# Patient Record
Sex: Female | Born: 1945 | Race: Black or African American | Hispanic: No | Marital: Married | Smoking: Former smoker
Health system: Southern US, Community
[De-identification: ages and names within clinical notes are randomized; demographics above are authoritative.]

## PROBLEM LIST (undated history)

## (undated) DIAGNOSIS — E785 Hyperlipidemia, unspecified: Secondary | ICD-10-CM

## (undated) DIAGNOSIS — I10 Essential (primary) hypertension: Secondary | ICD-10-CM

## (undated) DIAGNOSIS — R12 Heartburn: Secondary | ICD-10-CM

## (undated) HISTORY — DX: Essential (primary) hypertension: I10

## (undated) HISTORY — DX: Hyperlipidemia, unspecified: E78.5

## (undated) HISTORY — PX: ABDOMINAL HYSTERECTOMY: SHX81

## (undated) HISTORY — PX: BREAST CYST ASPIRATION: SHX578

## (undated) HISTORY — PX: BREAST EXCISIONAL BIOPSY: SUR124

## (undated) HISTORY — PX: ABDOMINAL HYSTERECTOMY: SUR658

## (undated) HISTORY — DX: Heartburn: R12

---

## 2004-10-06 ENCOUNTER — Ambulatory Visit: Payer: Self-pay | Admitting: Obstetrics and Gynecology

## 2005-01-02 ENCOUNTER — Ambulatory Visit: Payer: Self-pay | Admitting: Gastroenterology

## 2005-10-31 ENCOUNTER — Ambulatory Visit: Payer: Self-pay | Admitting: Obstetrics and Gynecology

## 2006-11-14 ENCOUNTER — Ambulatory Visit: Payer: Self-pay | Admitting: Obstetrics and Gynecology

## 2007-02-21 ENCOUNTER — Ambulatory Visit: Payer: Self-pay

## 2007-11-21 ENCOUNTER — Ambulatory Visit: Payer: Self-pay | Admitting: Obstetrics and Gynecology

## 2008-04-03 ENCOUNTER — Ambulatory Visit: Payer: Self-pay | Admitting: Gastroenterology

## 2008-11-23 ENCOUNTER — Ambulatory Visit: Payer: Self-pay | Admitting: Obstetrics and Gynecology

## 2009-10-04 ENCOUNTER — Ambulatory Visit: Payer: Self-pay | Admitting: Internal Medicine

## 2009-10-28 ENCOUNTER — Ambulatory Visit: Payer: Self-pay | Admitting: Internal Medicine

## 2009-11-04 ENCOUNTER — Ambulatory Visit: Payer: Self-pay | Admitting: Internal Medicine

## 2009-12-01 ENCOUNTER — Ambulatory Visit: Payer: Self-pay | Admitting: Obstetrics and Gynecology

## 2009-12-04 ENCOUNTER — Ambulatory Visit: Payer: Self-pay | Admitting: Internal Medicine

## 2010-12-05 ENCOUNTER — Ambulatory Visit: Payer: Self-pay | Admitting: Obstetrics and Gynecology

## 2011-12-15 ENCOUNTER — Ambulatory Visit: Payer: Self-pay | Admitting: Obstetrics and Gynecology

## 2012-12-16 ENCOUNTER — Ambulatory Visit: Payer: Self-pay | Admitting: Obstetrics and Gynecology

## 2013-01-23 ENCOUNTER — Emergency Department: Payer: Self-pay | Admitting: Emergency Medicine

## 2013-01-23 LAB — BASIC METABOLIC PANEL
Anion Gap: 2 — ABNORMAL LOW (ref 7–16)
BUN: 17 mg/dL (ref 7–18)
Calcium, Total: 9.2 mg/dL (ref 8.5–10.1)
Creatinine: 0.99 mg/dL (ref 0.60–1.30)
EGFR (Non-African Amer.): 58 — ABNORMAL LOW
Glucose: 99 mg/dL (ref 65–99)
Potassium: 3.6 mmol/L (ref 3.5–5.1)
Sodium: 139 mmol/L (ref 136–145)

## 2013-01-23 LAB — CBC
HCT: 40.3 % (ref 35.0–47.0)
MCH: 28.6 pg (ref 26.0–34.0)
MCHC: 34.2 g/dL (ref 32.0–36.0)
MCV: 84 fL (ref 80–100)
Platelet: 235 10*3/uL (ref 150–440)
WBC: 5.1 10*3/uL (ref 3.6–11.0)

## 2013-01-23 LAB — TROPONIN I
Troponin-I: <0.02 ng/mL
Troponin-I: <0.02 ng/mL

## 2013-01-23 LAB — PROTIME-INR
INR: 0.9
Prothrombin Time: 12.4 secs (ref 11.5–14.7)

## 2013-01-23 LAB — PRO B NATRIURETIC PEPTIDE: B-Type Natriuretic Peptide: 10 pg/mL (ref 0–125)

## 2013-12-31 ENCOUNTER — Ambulatory Visit: Payer: Self-pay | Admitting: Obstetrics and Gynecology

## 2014-01-15 ENCOUNTER — Ambulatory Visit: Payer: Self-pay | Admitting: Gastroenterology

## 2014-12-23 ENCOUNTER — Other Ambulatory Visit: Payer: Self-pay | Admitting: Obstetrics and Gynecology

## 2014-12-23 DIAGNOSIS — Z1231 Encounter for screening mammogram for malignant neoplasm of breast: Secondary | ICD-10-CM

## 2015-01-04 ENCOUNTER — Ambulatory Visit: Payer: Self-pay

## 2015-01-06 ENCOUNTER — Ambulatory Visit: Payer: Self-pay

## 2015-01-07 ENCOUNTER — Ambulatory Visit
Admission: RE | Admit: 2015-01-07 | Discharge: 2015-01-07 | Disposition: A | Discharge status: Home / Self Care | Payer: Medicare Other | Source: Ambulatory Visit | Attending: Obstetrics and Gynecology | Admitting: Obstetrics and Gynecology

## 2015-01-07 ENCOUNTER — Other Ambulatory Visit: Payer: Self-pay | Admitting: Obstetrics and Gynecology

## 2015-01-07 DIAGNOSIS — Z1231 Encounter for screening mammogram for malignant neoplasm of breast: Secondary | ICD-10-CM | POA: Insufficient documentation

## 2015-12-29 ENCOUNTER — Other Ambulatory Visit: Payer: Self-pay | Admitting: Obstetrics and Gynecology

## 2015-12-29 DIAGNOSIS — Z1231 Encounter for screening mammogram for malignant neoplasm of breast: Secondary | ICD-10-CM

## 2016-01-17 ENCOUNTER — Ambulatory Visit
Admission: RE | Admit: 2016-01-17 | Discharge: 2016-01-17 | Disposition: A | Discharge status: Home / Self Care | Payer: Medicare Other | Source: Ambulatory Visit | Attending: Obstetrics and Gynecology | Admitting: Obstetrics and Gynecology

## 2016-01-17 DIAGNOSIS — Z1231 Encounter for screening mammogram for malignant neoplasm of breast: Secondary | ICD-10-CM | POA: Insufficient documentation

## 2016-02-15 ENCOUNTER — Encounter: Payer: Self-pay | Admitting: Urology

## 2016-02-15 ENCOUNTER — Ambulatory Visit: Payer: Medicare Other | Admitting: Urology

## 2016-02-15 VITALS — BP 119/79 | HR 91 | Ht 63.25 in | Wt 147.4 lb

## 2016-02-15 DIAGNOSIS — N952 Postmenopausal atrophic vaginitis: Secondary | ICD-10-CM | POA: Diagnosis not present

## 2016-02-15 DIAGNOSIS — N3941 Urge incontinence: Secondary | ICD-10-CM | POA: Diagnosis not present

## 2016-02-15 DIAGNOSIS — N390 Urinary tract infection, site not specified: Secondary | ICD-10-CM | POA: Diagnosis not present

## 2016-02-15 LAB — BLADDER SCAN AMB NON-IMAGING: Scan Result: 11

## 2016-02-15 MED ORDER — ESTROGENS, CONJUGATED 0.625 MG/GM VA CREA: 1.0000 | TOPICAL_CREAM | Freq: Every day | VAGINAL | 12 refills | Status: DC

## 2016-02-15 MED ORDER — ESTRADIOL 0.1 MG/GM VA CREA: TOPICAL_CREAM | VAGINAL | 12 refills | Status: DC

## 2016-02-15 NOTE — Progress Notes (Signed)
02/15/2016 10:08 AM   Anne Oconnell 1945/03/20 [id number]  Referring provider: Jerl MinaJames Hedrick, MD [address] [facility] [place], [place] [id number]  Chief Complaint  Patient presents with  . New Patient (Initial Visit)    recurrent uti referred by Dr. Ronny FlurryHeadrick    HPI: Patient is a 70 -year-old [place] female who is referred to us by, Dr. Burnett ShengHedrick, for recurrent urinary tract infections.  Patient states that she has had two urinary tract infections back to back.    Her symptoms with a urinary tract infection consist of twinge at the end of the stream, urgency and frequency.  She was given two rounds of antibiotics and her symptoms abated.    She denies dysuria, gross hematuria, suprapubic pain, back pain, abdominal pain or flank pain.  She has not had any recent fevers, chills, nausea or vomiting.   She does not have a history of nephrolithiasis, GU surgery or GU trauma.   Reviewing her records,  she has had three documented positive cultures for E.coli over the last two years.    She is not sexually active.  She has not noted a correlation with her urinary tract infections and sexual intercourse.    She is post menopausal.   She denies constipation and/or diarrhea.   She does engage in good perineal hygiene. She does take tub baths.   She does have urge incontinence once or twice a week.  She is not using incontinence pads.   She is not having pain with bladder filling.    She has not had any recent imaging studies.    She is drinking not enough of water daily.  She drinks most of her water at night.  She drinks sweat tea, orange soda's and Pepsi.  Her PVR today was 11 mL.     PMH: Past Medical History:  Diagnosis Date  . Heartburn   . HLD (hyperlipidemia)   . HTN (hypertension)     Surgical History: Past Surgical History:  Procedure Laterality Date  . ABDOMINAL HYSTERECTOMY    . BREAST EXCISIONAL BIOPSY Right    1991 negative  . BREAST  EXCISIONAL BIOPSY Left    1964 negative 1990 negative    Home Medications:    Medication List       Accurate as of 02/15/16 10:08 AM. Always use your most recent med list.          amLODipine-benazepril 5-10 MG capsule Commonly known as:  LOTREL TAKE ONE CAPSULE BY MOUTH ONCE A DAY   aspirin EC 81 MG tablet Take by mouth.   atorvastatin 10 MG tablet Commonly known as:  LIPITOR Take by mouth.   Calcium Carbonate-Vitamin D3 600-400 MG-UNIT Tabs Take by mouth.   conjugated estrogens vaginal cream Commonly known as:  PREMARIN Place 1 Applicatorful vaginally daily. Apply 0.5mg  (pea-sized amount)  just inside the vaginal introitus with a finger-tip every night for two weeks and then Monday, Wednesday and Friday nights.   estradiol 0.1 MG/GM vaginal cream Commonly known as:  ESTRACE VAGINAL Apply 0.5mg  (pea-sized amount)  just inside the vaginal introitus with a finger-tip every night for two weeks and then Monday, Wednesday and Friday nights.   FISH OIL PO Take by mouth.   multivitamin capsule Take by mouth.   omeprazole 40 MG capsule Commonly known as:  PRILOSEC Take by mouth.   VITAMIN D-1000 MAX ST 1000 units tablet Generic drug:  Cholecalciferol Take by mouth.  Allergies: No Known Allergies  Family History: Family History  Problem Relation Age of Onset  . Liver cancer Maternal Aunt   . Breast cancer Neg Hx   . Kidney cancer Neg Hx   . Prostate cancer Neg Hx   . Bladder Cancer Neg Hx     Social History:  reports that she quit smoking about 47 years ago. Her smoking use included Cigarettes. She has never used smokeless tobacco. She reports that she does not drink alcohol or use drugs.  ROS: UROLOGY Frequent Urination?: No Hard to postpone urination?: No Burning/pain with urination?: No Get up at night to urinate?: Yes Leakage of urine?: No Urine stream starts and stops?: No Trouble starting stream?: No Do you have to strain to urinate?:  No Blood in urine?: No Urinary tract infection?: Yes Sexually transmitted disease?: No Injury to kidneys or bladder?: No Painful intercourse?: No Weak stream?: No Currently pregnant?: No Vaginal bleeding?: No Last menstrual period?: n  Gastrointestinal Nausea?: No Vomiting?: No Indigestion/heartburn?: Yes Diarrhea?: No Constipation?: No  Constitutional Fever: No Night sweats?: Yes Weight loss?: No Fatigue?: No  Skin Skin rash/lesions?: No Itching?: No  Eyes Blurred vision?: No Double vision?: No  Ears/Nose/Throat Sore throat?: No Sinus problems?: Yes  Hematologic/Lymphatic Swollen glands?: No Easy bruising?: No  Cardiovascular Leg swelling?: No Chest pain?: No  Respiratory Cough?: No Shortness of breath?: No  Endocrine Excessive thirst?: No  Musculoskeletal Back pain?: No Joint pain?: No  Neurological Headaches?: No Dizziness?: No  Psychologic Depression?: No Anxiety?: No  Physical Exam: BP 119/79   Pulse 91   Ht 5' 3.25" (1.607 m)   Wt 147 lb 6.4 oz (66.9 kg)   BMI 25.90 kg/m   Constitutional: Well nourished. Alert and oriented, No acute distress. HEENT: [place] AT, moist mucus membranes. Trachea midline, no masses. Cardiovascular: No clubbing, cyanosis, or edema. Respiratory: Normal respiratory effort, no increased work of breathing. GI: Abdomen is soft, non tender, non distended, no abdominal masses. Liver and spleen not palpable.  No hernias appreciated.  Stool sample for occult testing is not indicated.   GU: No CVA tenderness.  No bladder fullness or masses.  Atrophic external genitalia, normal pubic hair distribution, no lesions.  Normal urethral meatus, no lesions, no prolapse, no discharge.   No urethral masses, tenderness and/or tenderness. No bladder fullness, tenderness or masses. Pale vagina mucosa, poor estrogen effect, no discharge, no lesions, good pelvic support, Grade II cystocele is noted.  Rectocele is noted.  Cervix, uterus  and adnexa are surgically absent.  Anus and perineum are without rashes or lesions.    Skin: No rashes, bruises or suspicious lesions. Lymph: No cervical or inguinal adenopathy. Neurologic: Grossly intact, no focal deficits, moving all 4 extremities. Psychiatric: Normal mood and affect.  Laboratory Data: Lab Results  Component Value Date   WBC 5.1 01/23/2013   HGB 13.8 01/23/2013   HCT 40.3 01/23/2013   MCV 84 01/23/2013   PLT 235 01/23/2013    Lab Results  Component Value Date   CREATININE 0.99 01/23/2013    Pertinent Imaging: Results for Anne Oconnell, Anne Oconnell (MRN [id number]) as of 02/15/2016 09:51  Ref. Range 02/15/2016 09:28  Scan Result Unknown 11    Assessment & Plan:    1. Recurrent UTI's  - Patient is instructed to increase her water intake until the urine is pale yellow or clear.  I have advised her to take probiotics (yogurt, oral pills or vaginal suppositories), take cranberry pills or drink the juice and  use the estrogen cream.  She is to take Vitamin C 1,000 mg daily to acidify the urine.  She should also avoid soaking in tubs and wipe front to back after urinating.  She may benefit from core strengthening exercises.  We can refer her to PT if she desires.    - Because of her history of recurrent UTIs, I have asked the patient to contact our office if she should experience symptoms of urinary tract infection so that we can CATH her for an urine specimen for urinalysis and culture. This is to prevent a skin contaminant from showing up in the urine culture.  If she should have her symptoms after hours or cannot get to our office, she should notify her other providers that she needs a catheterized specimen for UA and culture.   - I reviewed the symptoms of a urinary tract infection, such as a worsening of urinary urgency and frequency, dysuria, which is painful urination and not the pain of urine hitting sensitive perineal skin, hematuria, foul-smelling urine, suprapubic pain or  mental status changes. Fevers, chills, nausea and or vomiting can also be signs of a possible UTI.  Positive urinalyses and positive urine cultures that are not associated with urinary symptoms should not be treated with antibiotics.    - I explained to the patient that being exposed to unnecessary antibiotics can put her at risk for increasing resistance of the bacteria to antibiotics, C. difficile and the side effects of the antibiotics.    2. Urge incontinence  - Patient encouraged to stop drinking sodas and sweat tea  3. Vaginal atrophy  - I explained to the patient that when women go through menopause and her estrogen levels are severely diminished, the normal vaginal flora will change.  This is due to an increase of the vaginal canal's pH. Because of this, the vaginal canal may be colonized by bacteria from the rectum instead of the protective lactobacillus.  This accompanied by the loss of the mucus barrier with vaginal atrophy is a cause of recurrent urinary tract infections.  - In some studies, the use of vaginal estrogen cream has been demonstrated to reduce  recurrent urinary tract infections to one a year.   - Patient was given a sample of vaginal estrogen cream (Premarin/Estrace) and instructed to apply 0.5mg  (pea-sized amount)  just inside the vaginal introitus with a finger-tip every night for two weeks and then Monday, Wednesday and Friday nights.  I explained to the patient that vaginally administered estrogen, which causes only a slight increase in the blood estrogen levels, have fewer contraindications and adverse systemic effects that oral HT.  - I have also given prescriptions for the Estrace cream and Premarin cream, so that the patient may carry them to the pharmacy to see which one of the branded creams would be most economical for her.  If she finds both medications cost prohibitive, she is instructed to call the office.  We can then call in a compounded vaginal estrogen cream for  the patient that may be more affordable.    - She will follow up in three months for an exam.                              Return in about 3 months (around 05/15/2016) for exam.  These notes generated with voice recognition software. I apologize for typographical errors.  Michiel CowboySHANNON Judy Pollman, PA-C  [facility] [address]  [address], [place], [place] [address] [phone]

## 2016-02-15 NOTE — Patient Instructions (Signed)
                                             Urinary Tract Infection Prevention Patient Education Stay Hydrated: Urinary tract infections (UTIs) are less likely to occur in someone who is drinking enough water to promote regular urination, so it is very important to stay hydrated in order to help flush out bacteria from the urinary tract. Respond to "Nature's Call": It is always a good idea to urinate as soon as you feel the need. While "holding it in" does not directly cause an infection, it can cause overdistension that can damage the lining of the bladder, making it more vulnerable to bacteria. Remove Tampons Before Going: Remember to always take out tampons before urinating, and change tampons often.  Practice Proper Bathroom Hygiene: To keep bacteria near the urethral opening to a minimum, it is important to practice proper wiping techniques (i.e. front to back wiping) to help prevent rectal bacteria from entering the uretro-genital area. It can also be helpful to take showers and avoid soaking in the bathtub.  Take a Vitamin C Supplement: About 1,000 milligrams of vitamin C taken daily can help inhibit the growth of some bacteria by acidifying the urine. Maintain Control with Cranberries: Cranberries contain hippuronic acid, which is a natural antiseptic that may help prevent the adherence of bacteria to the bladder lining. Drinking 100% pure cranberry juice or taking over the counter cranberry supplements twice daily may help to prevent an infection. However, it is important to note that cranberry juices/supplements are not helpful once a urinary tract infection (UTI) is present. Strengthen Your Core: Often, a lazy bladder (unable to empty urine properly) occurs due to lower back problem, so consider doing exercises to help strengthen your back, pelvic floor, and stomach muscles.  Pay Attention to Your Urine: Your urine can change color for a variety of reasons, including from the medications you  take, so pay close attention to it to monitor your overall health. One key thing to note is that if your urine is typically a darker yellow, your body is dehydrated, so you need to step up your water intake.    Apply vaginal estrogen cream with fingertip on Monday nights, Wednesday nights and Friday nights

## 2016-05-17 NOTE — Progress Notes (Signed)
05/18/2016 9:29 AM   Anne Oconnell 04-09-1945 [id number]  Referring provider: Jerl Mina, MD [address] [facility] [place], [place] [address]  Chief Complaint  Patient presents with  . Urinary Incontinence    3 month follow up   . Vaginal Atrophy    HPI: 71 yo AAF who presents for a three month follow up for a history of recurrent UTI's, urge incontinence and vaginal atrophy.  Background history Patient is a 80 -year-old [place] American female who is referred to [place] by, Dr. Burnett Sheng, for recurrent urinary tract infections.  Patient states that she has had two urinary tract infections back to back.  Her symptoms with a urinary tract infection consist of twinge at the end of the stream, urgency and frequency.  She was given two rounds of antibiotics and her symptoms abated.  She denies dysuria, gross hematuria, suprapubic pain, back pain, abdominal pain or flank pain.  She has not had any recent fevers, chills, nausea or vomiting.  She does not have a history of nephrolithiasis, GU surgery or GU trauma.  Reviewing her records,  she has had three documented positive cultures for E.coli over the last two years.  She is not sexually active.  She has not noted a correlation with her urinary tract infections and sexual intercourse.  She is post menopausal.  She denies constipation and/or diarrhea.   She does engage in good perineal hygiene. She does take tub baths.   She does have urge incontinence once or twice a week.  She is not using incontinence pads.  She is not having pain with bladder filling.   She has not had any recent imaging studies.  She is drinking not enough of water daily.  She drinks most of her water at night.  She drinks sweat tea, orange soda's and Pepsi.  Her PVR today was 11 mL.    History of recurrent UTI's She has had three documented positive cultures for E.coli over the last two years.  Risk factors for UTI's:  Vaginal atrophy, soaking in tubs, incontinence  and consumption of sugary drinks.  At her last appointment we discussed UTI prevention strategies.  She has not had an UTI since her visit with [place] three months ago.  She is not experiencing gross hematuria, dysuria and suprapubic.  She does not have fevers, chills, nausea or vomiting.    Urge incontinence Patient advised to avoid sugary drinks.  She is experiencing urgency x 0-3, frequency x 4-7, she does not limit fluids to avoid using the rest room, she does not engage in toilet mapping, incontinence x 0-3 and nocturia x 0-3.    Vaginal atrophy Patient is applying the vaginal estrogen cream 3 nights weekly.   PMH: Past Medical History:  Diagnosis Date  . Heartburn   . HLD (hyperlipidemia)   . HTN (hypertension)     Surgical History: Past Surgical History:  Procedure Laterality Date  . ABDOMINAL HYSTERECTOMY    . BREAST EXCISIONAL BIOPSY Right    1991 negative  . BREAST EXCISIONAL BIOPSY Left    1964 negative 1990 negative    Home Medications:  Allergies as of 05/18/2016   No Known Allergies     Medication List       Accurate as of 05/18/16  9:29 AM. Always use your most recent med list.          amLODipine-benazepril 5-10 MG capsule Commonly known as:  LOTREL TAKE ONE CAPSULE BY MOUTH ONCE  A DAY   aspirin EC 81 MG tablet Take by mouth.   atorvastatin 10 MG tablet Commonly known as:  LIPITOR Take by mouth.   Calcium Carbonate-Vitamin D3 600-400 MG-UNIT Tabs Take by mouth.   conjugated estrogens vaginal cream Commonly known as:  PREMARIN Place 1 Applicatorful vaginally daily. Apply 0.5mg  (pea-sized amount)  just inside the vaginal introitus with a finger-tip every night for two weeks and then Monday, Wednesday and Friday nights.   estradiol 0.1 MG/GM vaginal cream Commonly known as:  ESTRACE VAGINAL Apply 0.5mg  (pea-sized amount)  just inside the vaginal introitus with a finger-tip every night for two weeks and then Monday, Wednesday and Friday nights.     FISH OIL PO Take by mouth.   multivitamin capsule Take by mouth.   omeprazole 40 MG capsule Commonly known as:  PRILOSEC Take by mouth.   VITAMIN D-1000 MAX ST 1000 units tablet Generic drug:  Cholecalciferol Take by mouth.       Allergies: No Known Allergies  Family History: Family History  Problem Relation Age of Onset  . Liver cancer Maternal Aunt   . Breast cancer Neg Hx   . Kidney cancer Neg Hx   . Prostate cancer Neg Hx   . Bladder Cancer Neg Hx     Social History:  reports that she quit smoking about 48 years ago. Her smoking use included Cigarettes. She has never used smokeless tobacco. She reports that she drinks alcohol. She reports that she does not use drugs.  ROS: UROLOGY Frequent Urination?: No Hard to postpone urination?: No Burning/pain with urination?: No Get up at night to urinate?: Yes Leakage of urine?: No Urine stream starts and stops?: No Trouble starting stream?: No Do you have to strain to urinate?: No Blood in urine?: No Urinary tract infection?: No Sexually transmitted disease?: No Injury to kidneys or bladder?: No Painful intercourse?: No Weak stream?: No Currently pregnant?: No Vaginal bleeding?: No Last menstrual period?: n  Gastrointestinal Nausea?: No Vomiting?: No Indigestion/heartburn?: No Diarrhea?: No Constipation?: No  Constitutional Fever: No Night sweats?: No Weight loss?: No Fatigue?: No  Skin Skin rash/lesions?: No Itching?: No  Eyes Blurred vision?: No Double vision?: No  Ears/Nose/Throat Sore throat?: No Sinus problems?: No  Hematologic/Lymphatic Swollen glands?: No Easy bruising?: No  Cardiovascular Leg swelling?: No Chest pain?: No  Respiratory Cough?: No Shortness of breath?: No  Endocrine Excessive thirst?: No  Musculoskeletal Back pain?: No Joint pain?: No  Neurological Headaches?: No Dizziness?: No  Psychologic Depression?: No Anxiety?: No  Physical Exam: BP 111/76    Pulse 94   Ht  (1.6 m)   Wt 148 lb 3.2 oz (67.2 kg)   BMI 26.25 kg/m   Constitutional: Well nourished. Alert and oriented, No acute distress. HEENT: [place] AT, moist mucus membranes. Trachea midline, no masses. Cardiovascular: No clubbing, cyanosis, or edema. Respiratory: Normal respiratory effort, no increased work of breathing. GI: Abdomen is soft, non tender, non distended, no abdominal masses. Liver and spleen not palpable.  No hernias appreciated.  Stool sample for occult testing is not indicated.   GU: No CVA tenderness.  No bladder fullness or masses.  Atrophic external genitalia, normal pubic hair distribution, no lesions.  Normal urethral meatus, no lesions, no prolapse, no discharge.   No urethral masses, tenderness and/or tenderness. No bladder fullness, tenderness or masses. Pale vagina mucosa, poor estrogen effect, no discharge, no lesions, good pelvic support, Grade II cystocele is noted.  Rectocele is noted.  Cervix, uterus  and adnexa are surgically absent.  Anus and perineum are without rashes or lesions.    Skin: No rashes, bruises or suspicious lesions. Lymph: No cervical or inguinal adenopathy. Neurologic: Grossly intact, no focal deficits, moving all 4 extremities. Psychiatric: Normal mood and affect.  Laboratory Data: Lab Results  Component Value Date   WBC 5.1 01/23/2013   HGB 13.8 01/23/2013   HCT 40.3 01/23/2013   MCV 84 01/23/2013   PLT 235 01/23/2013    Lab Results  Component Value Date   CREATININE 0.99 01/23/2013    Assessment & Plan:    1. Recurrent UTI's  - Reviewed UTI prevention strategies  - No infections since we had last seen the patient  - Asked the patient is to contact us if she should experience UTI symptoms so that we may get a catheter specimen for urinalysis and culture   2. Urge incontinence  - Improved with the vaginal estrogen cream  3. Vaginal atrophy  - Continue applying the vaginal estrogen cream 3 nights weekly                      Return in about 1 year (around 05/18/2017) for PVR, exam and OAB questionnaire.  These notes generated with voice recognition software. I apologize for typographical errors.  Michiel Cowboy, PA-C  [facility] [address] [place], [place] [address] [phone]

## 2016-05-18 ENCOUNTER — Ambulatory Visit: Payer: Medicare Other | Admitting: Urology

## 2016-05-18 ENCOUNTER — Encounter: Payer: Self-pay | Admitting: Urology

## 2016-05-18 VITALS — BP 111/76 | HR 94 | Ht 63.0 in | Wt 148.2 lb

## 2016-05-18 DIAGNOSIS — N3941 Urge incontinence: Secondary | ICD-10-CM | POA: Diagnosis not present

## 2016-05-18 DIAGNOSIS — N952 Postmenopausal atrophic vaginitis: Secondary | ICD-10-CM | POA: Diagnosis not present

## 2016-05-18 DIAGNOSIS — N390 Urinary tract infection, site not specified: Secondary | ICD-10-CM

## 2017-01-11 ENCOUNTER — Other Ambulatory Visit: Payer: Self-pay | Admitting: Obstetrics and Gynecology

## 2017-01-11 DIAGNOSIS — Z1231 Encounter for screening mammogram for malignant neoplasm of breast: Secondary | ICD-10-CM

## 2017-02-06 ENCOUNTER — Ambulatory Visit
Admission: RE | Admit: 2017-02-06 | Discharge: 2017-02-06 | Disposition: A | Discharge status: Home / Self Care | Payer: Medicare Other | Source: Ambulatory Visit | Attending: Obstetrics and Gynecology | Admitting: Obstetrics and Gynecology

## 2017-02-06 DIAGNOSIS — Z1231 Encounter for screening mammogram for malignant neoplasm of breast: Secondary | ICD-10-CM | POA: Insufficient documentation

## 2017-05-16 NOTE — Progress Notes (Signed)
05/17/2017 9:45 AM   Anne Oconnell Sep 25, 1945 [id number]  Referring provider: Jerl Mina, MD [address] [facility] [place], [place] [address]  No chief complaint on file.   HPI: 72 yo AAF who presents for a one year follow up for a history of recurrent UTI's, urge incontinence and vaginal atrophy.  History of recurrent UTI's Risk factors for UTI's:  Vaginal atrophy, soaking in tubs, incontinence and consumption of sugary drinks.  At her last appointment we discussed UTI prevention strategies.  She has not had an UTI since her visit with [place] 12 months ago.  She is not experiencing gross hematuria, dysuria and suprapubic.  She does not have fevers, chills, nausea or vomiting.  She has not had an UTI since her last visit with [place].    Urge incontinence Patient advised to avoid sugary drinks.  She is experiencing urgency x 0-3 (stable), frequency x 4-7 (stable), she does not limit fluids to avoid using the rest room, she does not engage in toilet mapping, incontinence x 0-3 (stable) and nocturia x 0-3 (stable).  Her main complaint is nocturia.  Patient denies any gross hematuria, dysuria or suprapubic/flank pain.  Patient denies any fevers, chills, nausea or vomiting.   Vaginal atrophy Patient is applying the vaginal estrogen cream occasionally.    PMH: Past Medical History:  Diagnosis Date  . Heartburn   . HLD (hyperlipidemia)   . HTN (hypertension)     Surgical History: Past Surgical History:  Procedure Laterality Date  . ABDOMINAL HYSTERECTOMY    . BREAST CYST ASPIRATION Left    Pt thinks it was her left breast was done yrs ago.  Marland Kitchen BREAST EXCISIONAL BIOPSY Right    1991 negative  . BREAST EXCISIONAL BIOPSY Left    1964 negative 1990 negative    Home Medications:  Allergies as of 05/17/2017   No Known Allergies     Medication List        Accurate as of 05/17/17  9:45 AM. Always use your most recent med list.          amLODipine-benazepril 5-10 MG  capsule Commonly known as:  LOTREL TAKE ONE CAPSULE BY MOUTH ONCE A DAY   aspirin EC 81 MG tablet Take by mouth.   atorvastatin 10 MG tablet Commonly known as:  LIPITOR Take by mouth.   Calcium Carbonate-Vitamin D3 600-400 MG-UNIT Tabs Take by mouth.   conjugated estrogens vaginal cream Commonly known as:  PREMARIN Place 1 Applicatorful vaginally daily. Apply 0.5mg  (pea-sized amount)  just inside the vaginal introitus with a finger-tip every night for two weeks and then Monday, Wednesday and Friday nights.   estradiol 0.1 MG/GM vaginal cream Commonly known as:  ESTRACE VAGINAL Apply 0.5mg  (pea-sized amount)  just inside the vaginal introitus with a finger-tip every night for two weeks and then Monday, Wednesday and Friday nights.   FISH OIL PO Take by mouth.   multivitamin capsule Take by mouth.   omeprazole 40 MG capsule Commonly known as:  PRILOSEC Take by mouth.   VITAMIN D-1000 MAX ST 1000 units tablet Generic drug:  Cholecalciferol Take by mouth.       Allergies: No Known Allergies  Family History: Family History  Problem Relation Age of Onset  . Liver cancer Maternal Aunt   . Breast cancer Neg Hx   . Kidney cancer Neg Hx   . Prostate cancer Neg Hx   . Bladder Cancer Neg Hx     Social History:  reports that she quit smoking about 49 years ago. Her smoking use included cigarettes. she has never used smokeless tobacco. She reports that she drinks alcohol. She reports that she does not use drugs.  ROS: UROLOGY Frequent Urination?: No Hard to postpone urination?: No Burning/pain with urination?: No Get up at night to urinate?: Yes Leakage of urine?: No Urine stream starts and stops?: No Trouble starting stream?: No Do you have to strain to urinate?: No Blood in urine?: No Urinary tract infection?: No Sexually transmitted disease?: No Injury to kidneys or bladder?: No Painful intercourse?: No Weak stream?: No Currently pregnant?: No Vaginal  bleeding?: No Last menstrual period?: n  Gastrointestinal Nausea?: No Vomiting?: No Indigestion/heartburn?: No Diarrhea?: No Constipation?: No  Constitutional Fever: No Night sweats?: No Weight loss?: No Fatigue?: No  Skin Skin rash/lesions?: No Itching?: No  Eyes Blurred vision?: No Double vision?: No  Ears/Nose/Throat Sore throat?: No Sinus problems?: No  Hematologic/Lymphatic Swollen glands?: No Easy bruising?: No  Cardiovascular Leg swelling?: No Chest pain?: No  Respiratory Cough?: No Shortness of breath?: No  Endocrine Excessive thirst?: No  Musculoskeletal Back pain?: No Joint pain?: No  Neurological Headaches?: No Dizziness?: No  Psychologic Depression?: No Anxiety?: No  Physical Exam: BP 112/78   Pulse (!) 102   Resp 16   Ht 5\' 4"  (1.626 m)   Wt 145 lb (65.8 kg)   SpO2 98%   BMI 24.89 kg/m   Constitutional: Well nourished. Alert and oriented, No acute distress. HEENT: [place] AT, moist mucus membranes. Trachea midline, no masses. Cardiovascular: No clubbing, cyanosis, or edema. Respiratory: Normal respiratory effort, no increased work of breathing. GI: Abdomen is soft, non tender, non distended, no abdominal masses. Liver and spleen not palpable.  No hernias appreciated.  Stool sample for occult testing is not indicated.   GU: No CVA tenderness.  No bladder fullness or masses.  Atrophic external genitalia, normal pubic hair distribution, no lesions.  Normal urethral meatus, no lesions, no prolapse, no discharge.   No urethral masses, tenderness and/or tenderness. No bladder fullness, tenderness or masses. Pale vagina mucosa, poor estrogen effect, no discharge, no lesions, poor pelvic support, Grade II cystocele noted.  Rectocele noted.  Cervix, uterus and adnexa are surgically absent.  Anus and perineum are without rashes or lesions.    Skin: No rashes, bruises or suspicious lesions. Lymph: No cervical or inguinal adenopathy. Neurologic:  Grossly intact, no focal deficits, moving all 4 extremities. Psychiatric: Normal mood and affect.    Laboratory Data: Lab Results  Component Value Date   WBC 5.1 01/23/2013   HGB 13.8 01/23/2013   HCT 40.3 01/23/2013   MCV 84 01/23/2013   PLT 235 01/23/2013    Lab Results  Component Value Date   CREATININE 0.99 01/23/2013    Assessment & Plan:    1. History of recurrent UTI's  - Reviewed UTI prevention strategies  - No infections since we had last seen the patient  - Asked the patient is to contact us if she should experience UTI symptoms so that we may get a catheter specimen for urinalysis and culture   2. Urge incontinence  - Resolved  3. Vaginal atrophy  - Continue applying the vaginal estrogen cream as needed  4. Nocturia Not bothersome at this time Will reassess in one year                  Return in about 1 year (around 05/18/2018) for OAB questionnaire, PVR and exam.  These notes generated with voice recognition software. I apologize for typographical errors.  Zara Council, [facility] [address], [place], [place] [address] [phone]

## 2017-05-17 ENCOUNTER — Ambulatory Visit (INDEPENDENT_AMBULATORY_CARE_PROVIDER_SITE_OTHER): Payer: Medicare Other | Admitting: Urology

## 2017-05-17 ENCOUNTER — Encounter: Payer: Self-pay | Admitting: Urology

## 2017-05-17 VITALS — BP 112/78 | HR 102 | Resp 16 | Ht 64.0 in | Wt 145.0 lb

## 2017-05-17 DIAGNOSIS — N3941 Urge incontinence: Secondary | ICD-10-CM | POA: Diagnosis not present

## 2017-05-17 DIAGNOSIS — R351 Nocturia: Secondary | ICD-10-CM | POA: Diagnosis not present

## 2017-05-17 DIAGNOSIS — Z87440 Personal history of urinary (tract) infections: Secondary | ICD-10-CM

## 2017-05-17 DIAGNOSIS — N952 Postmenopausal atrophic vaginitis: Secondary | ICD-10-CM

## 2018-01-15 ENCOUNTER — Other Ambulatory Visit: Payer: Self-pay | Admitting: Obstetrics and Gynecology

## 2018-01-15 DIAGNOSIS — Z1231 Encounter for screening mammogram for malignant neoplasm of breast: Secondary | ICD-10-CM

## 2018-02-13 ENCOUNTER — Ambulatory Visit
Admission: RE | Admit: 2018-02-13 | Discharge: 2018-02-13 | Disposition: A | Discharge status: Home / Self Care | Payer: Medicare Other | Source: Ambulatory Visit | Attending: Obstetrics and Gynecology | Admitting: Obstetrics and Gynecology

## 2018-02-13 DIAGNOSIS — Z1231 Encounter for screening mammogram for malignant neoplasm of breast: Secondary | ICD-10-CM | POA: Insufficient documentation

## 2018-05-22 NOTE — Progress Notes (Incomplete)
05/24/2018 3:08 PM   Anne Oconnell Sep 16, 1945 [id number]  Referring provider: Jerl Mina, MD [address] [facility] [place], [place] [address]  No chief complaint on file.   HPI: Anne Oconnell is a 73 y.o. female who presents for a one year follow up for a history of recurrent UTI's, urge incontinence and vaginal atrophy.  History of recurrent UTI's Risk factors for UTI's:  Vaginal atrophy, soaking in tubs, incontinence and consumption of sugary drinks.  On her 05/2016 visit we discussed UTI prevention strategies.  On her 05/17/2017, she had not had an UTI since her visit with [place] 12 months prior.  She was not experiencing gross hematuria, dysuria and suprapubic.  She denied fevers, chills, nausea or vomiting.  *** She has not had an UTI since her last visit with [place].  Urge incontinence Patient has been advised to avoid sugary drinks.  On 05/17/2017, she was experiencing urgency x 0-3 (stable), frequency x 4-7 (stable), she did not limit fluids to avoid using the rest room, she did not engage in toilet mapping, incontinence x 0-3 (stable) and nocturia x 0-3 (stable).  Her main complaint was nocturia.  Patient denied any gross hematuria, dysuria or suprapubic/flank pain.  Patient denied any fevers, chills, nausea or vomiting.   Today (05/24/2018), the patient is experiencing urgency x *** (***), frequency x *** (***), not/is restricting fluids to avoid visits to the restroom ***, not/is engaging in toilet mapping, incontinence x *** (***) and nocturia x *** (***).  Her BP is ***.  Her PVR is *** mL.  ***  Vaginal atrophy On 05/17/2017, patient reported that she was applying the vaginal estrogen cream occasionally.    PMH: Past Medical History:  Diagnosis Date   Heartburn    HLD (hyperlipidemia)    HTN (hypertension)     Surgical History: Past Surgical History:  Procedure Laterality Date   ABDOMINAL HYSTERECTOMY     BREAST CYST ASPIRATION Left    Pt thinks  it was her left breast was done yrs ago.   BREAST EXCISIONAL BIOPSY Right    1991 negative   BREAST EXCISIONAL BIOPSY Left    1964 negative 1990 negative    Home Medications:  Allergies as of 05/24/2018   No Known Allergies     Medication List       Accurate as of May 22, 2018  3:08 PM. Always use your most recent med list.        amLODipine-benazepril 5-10 MG capsule Commonly known as:  LOTREL TAKE ONE CAPSULE BY MOUTH ONCE A DAY   aspirin EC 81 MG tablet Take by mouth.   atorvastatin 10 MG tablet Commonly known as:  LIPITOR Take by mouth.   Calcium Carbonate-Vitamin D3 600-400 MG-UNIT Tabs Take by mouth.   conjugated estrogens vaginal cream Commonly known as:  Premarin Place 1 Applicatorful vaginally daily. Apply 0.5mg  (pea-sized amount)  just inside the vaginal introitus with a finger-tip every night for two weeks and then Monday, Wednesday and Friday nights.   estradiol 0.1 MG/GM vaginal cream Commonly known as:  ESTRACE VAGINAL Apply 0.5mg  (pea-sized amount)  just inside the vaginal introitus with a finger-tip every night for two weeks and then Monday, Wednesday and Friday nights.   FISH OIL PO Take by mouth.   multivitamin capsule Take by mouth.   omeprazole 40 MG capsule Commonly known as:  PRILOSEC Take by mouth.   Vitamin D-1000 Max St 25 MCG (1000 UT) tablet Generic  drug:  Cholecalciferol Take by mouth.       Allergies: No Known Allergies  Family History: Family History  Problem Relation Age of Onset   Liver cancer Maternal Aunt    Breast cancer Neg Hx    Kidney cancer Neg Hx    Prostate cancer Neg Hx    Bladder Cancer Neg Hx     Social History:  reports that she quit smoking about 50 years ago. Her smoking use included cigarettes. She has never used smokeless tobacco. She reports current alcohol use. She reports that she does not use drugs.  ROS:                                        Physical  Exam: There were no vitals taken for this visit.  Constitutional:  Well nourished. Alert and oriented, No acute distress. {HEENT: [place] AT, moist mucus membranes.  Trachea midline, no masses.} Cardiovascular: No clubbing, cyanosis, or edema. Respiratory: Normal respiratory effort, no increased work of breathing. {GI: Abdomen is soft, non tender, non distended, no abdominal masses. Liver and spleen not palpable.  No hernias appreciated.  Stool sample for occult testing is not indicated.} {GU: No CVA tenderness.  No bladder fullness or masses.  Patient with circumcised/uncircumcised phallus. ***Foreskin easily retracted***  Urethral meatus is patent.  No penile discharge. No penile lesions or rashes. Scrotum without lesions, cysts, rashes and/or edema.  Testicles are located scrotally bilaterally. No masses are appreciated in the testicles. Left and right epididymis are normal.} {Rectal: Patient with normal sphincter tone.  Anus and perineum without scarring or rashes.  No rectal masses are appreciated. Prostate is approximately *** grams, *** nodules are appreciated.  Seminal vesicles are normal.} Skin: No rashes, bruises or suspicious lesions. {Lymph: No cervical or inguinal adenopathy.} Neurologic: Grossly intact, no focal deficits, moving all 4 extremities. Psychiatric: Normal mood and affect.  Laboratory Data: Lab Results  Component Value Date   WBC 5.1 01/23/2013   HGB 13.8 01/23/2013   HCT 40.3 01/23/2013   MCV 84 01/23/2013   PLT 235 01/23/2013    Lab Results  Component Value Date   CREATININE 0.99 01/23/2013    No results found for: PSA  No results found for: TESTOSTERONE  No results found for: HGBA1C  No results found for: TSH  No results found for: CHOL, HDL, CHOLHDL, VLDL, LDLCALC  No results found for: AST No results found for: ALT No components found for: ALKALINEPHOPHATASE No components found for: BILIRUBINTOTAL  No results found for: ESTRADIOL  Urinalysis No  results found for: COLORURINE, APPEARANCEUR, LABSPEC, PHURINE, GLUCOSEU, HGBUR, BILIRUBINUR, KETONESUR, PROTEINUR, UROBILINOGEN, NITRITE, LEUKOCYTESUR  I have reviewed the labs.  Assessment & Plan:    1. History of recurrent UTI's *** - Reviewed UTI prevention strategies *** - No infections since we had last seen the patient *** - Asked the patient is to contact us if she should experience UTI symptoms so that we may get a catheter specimen for urinalysis and culture   2. Urge incontinence - Resolved  3. Vaginal atrophy *** - Continue applying the vaginal estrogen cream as needed  4. Nocturia *** - Not bothersome at this time *** - Will reassess in one year                  No follow-ups on file.  Cloretta NedSHANNON MCGOWAN, PA-C  [facility] [address]  [address], [address] [place], [place] [address] [phone]  I, Duanne Moron, am acting as a Neurosurgeon for [employer], PA-C.   {Add Scribe Attestation Statement}

## 2018-05-24 ENCOUNTER — Telehealth: Payer: Self-pay | Admitting: Urology

## 2018-05-24 ENCOUNTER — Ambulatory Visit: Payer: Medicare Other | Admitting: Urology

## 2018-05-24 MED ORDER — ESTRADIOL 0.1 MG/GM VA CREA: TOPICAL_CREAM | VAGINAL | 0 refills | Status: DC

## 2018-05-24 NOTE — Telephone Encounter (Signed)
Patient will need another RX for Estrace. RX was sent to pharmacy.

## 2018-05-24 NOTE — Telephone Encounter (Signed)
Would you check to see if Anne Oconnell needs refills on vaginal estrogen cream to get her to her May appointment?  If she does need a refill, would you clarify what brand she is using, Premarin or Estrace?

## 2018-07-31 ENCOUNTER — Other Ambulatory Visit: Payer: Self-pay

## 2018-07-31 ENCOUNTER — Ambulatory Visit: Payer: Medicare Other | Admitting: Urology

## 2018-07-31 ENCOUNTER — Telehealth: Payer: Self-pay | Admitting: Urology

## 2018-07-31 ENCOUNTER — Telehealth (INDEPENDENT_AMBULATORY_CARE_PROVIDER_SITE_OTHER): Payer: Medicare Other | Admitting: Urology

## 2018-07-31 DIAGNOSIS — E785 Hyperlipidemia, unspecified: Secondary | ICD-10-CM | POA: Insufficient documentation

## 2018-07-31 DIAGNOSIS — R351 Nocturia: Secondary | ICD-10-CM

## 2018-07-31 DIAGNOSIS — N809 Endometriosis, unspecified: Secondary | ICD-10-CM | POA: Insufficient documentation

## 2018-07-31 DIAGNOSIS — I10 Essential (primary) hypertension: Secondary | ICD-10-CM | POA: Insufficient documentation

## 2018-07-31 DIAGNOSIS — Z87440 Personal history of urinary (tract) infections: Secondary | ICD-10-CM

## 2018-07-31 DIAGNOSIS — N952 Postmenopausal atrophic vaginitis: Secondary | ICD-10-CM

## 2018-07-31 NOTE — Telephone Encounter (Signed)
Would you call Anne Oconnell and schedule her for a one year follow up for OAB questionnaire, PVR and exam?

## 2018-07-31 NOTE — Progress Notes (Signed)
Virtual Visit via Audio/Visual Note  I connected with Anne Oconnell on 07/31/2018 at 0900 by audio/visual ([email or web address]) and verified that I am speaking with the correct person using two identifiers.  They are located at home.  I am located at my home. I was able to see her, but she could not get her camera to work.    This visit type was conducted due to national recommendations for restrictions regarding the COVID-19 Pandemic (e.g. social distancing).  This format is felt to be most appropriate for this patient at this time.  All issues noted in this document were discussed and addressed.  No physical exam was performed.   I discussed the limitations, risks, security and privacy concerns of performing an evaluation and management service by telephone and the availability of in person appointments. I also discussed with the patient that there may be a patient responsible charge related to this service. The patient expressed understanding and agreed to proceed.   History of Present Illness: Anne Oconnell is a 73 year old African American female with a history of rUTI's, urge incontinence, vaginal atrophy and nocturia who is contacted via audio/visual communications for a one year follow up due to COVID-19 pandemic restrictions.    Concerning rUTI's, she has not had any documented UTI's over the last year.  Patient denies any gross hematuria, dysuria or suprapubic/flank pain.  Patient denies any fevers, chills, nausea or vomiting.   Concerning urge incontinence, she does not have any issues with incontinence.    Concerning vaginal atrophy, her most recent mammogram was in 02/2018 and was negative.  She is applying the cream TID weekly.  She is not having vaginal irritation, rash or bleeding.    Concerning nocturia, she is only getting up once a night and that occurs around 5 am.  This is not bothersome to her.    Observations/Objective: Anne Oconnell is in her kitchen and dressed appropriately.  She does not  appear distressed.    Assessment and Plan:  1. History of rUTI's No reports of UTI's over the last year  Will contact us if she should experience any symptoms of an UTI  2. Vaginal atrophy Will continue to apply the cream TID weekly  3. Nocturia Happens once in the early am, not bothersome to her  Follow Up Instructions:  Anne Oconnell will follow up in one year for OAB questionnaire, exam and PVR.     I discussed the assessment and treatment plan with the patient. The patient was provided an opportunity to ask questions and all were answered. The patient agreed with the plan and demonstrated an understanding of the instructions.   The patient was advised to call back or seek an in-person evaluation if the symptoms worsen or if the condition fails to improve as anticipated.  I provided 6 minutes of non-face-to-face time during this encounter.   Calen Posch, PA-C

## 2019-02-13 ENCOUNTER — Other Ambulatory Visit: Payer: Self-pay | Admitting: Obstetrics and Gynecology

## 2019-02-13 DIAGNOSIS — Z1231 Encounter for screening mammogram for malignant neoplasm of breast: Secondary | ICD-10-CM

## 2019-02-19 ENCOUNTER — Ambulatory Visit
Admission: RE | Admit: 2019-02-19 | Discharge: 2019-02-19 | Disposition: A | Discharge status: Home / Self Care | Payer: Medicare Other | Source: Ambulatory Visit | Attending: Obstetrics and Gynecology | Admitting: Obstetrics and Gynecology

## 2019-02-19 DIAGNOSIS — Z1231 Encounter for screening mammogram for malignant neoplasm of breast: Secondary | ICD-10-CM | POA: Diagnosis present

## 2019-07-31 ENCOUNTER — Ambulatory Visit: Payer: Medicare Other | Admitting: Urology

## 2019-08-05 ENCOUNTER — Encounter: Payer: Self-pay | Admitting: Urology

## 2019-08-05 ENCOUNTER — Encounter (INDEPENDENT_AMBULATORY_CARE_PROVIDER_SITE_OTHER): Payer: Self-pay

## 2019-08-05 ENCOUNTER — Other Ambulatory Visit: Payer: Self-pay

## 2019-08-05 ENCOUNTER — Ambulatory Visit: Payer: Medicare PPO | Admitting: Urology

## 2019-08-05 VITALS — BP 112/76 | HR 89 | Ht 64.0 in | Wt 140.0 lb

## 2019-08-05 DIAGNOSIS — N952 Postmenopausal atrophic vaginitis: Secondary | ICD-10-CM | POA: Diagnosis not present

## 2019-08-05 DIAGNOSIS — Z87440 Personal history of urinary (tract) infections: Secondary | ICD-10-CM

## 2019-08-05 LAB — BLADDER SCAN AMB NON-IMAGING: Scan Result: 106

## 2019-08-05 NOTE — Progress Notes (Signed)
08/05/19 10:22 PM   Madisen Tawni Millers [birth year] [id number]  Referring provider: Maryland Pink, MD [address] [facility] [place],  [place] [address] Chief Complaint  Patient presents with  . Recurrent UTI    HPI: Anne Oconnell is a 74 y.o. who presents today for a one year follow up for OAB questionnaire and PVR.  History of recurrent UTI's Risk factors for UTI's:  Vaginal atrophy, soaking in tubs, incontinence and consumption of sugary drinks.    She has not had documented urinary tract infection since her last visit with [place] nor is she had symptoms of a urinary tract infection.  She is asymptomatic at this visit.  Patient denies any modifying or aggravating factors.  Patient denies any gross hematuria, dysuria or suprapubic/flank pain.  Today's UA is unremarkable.  Patient denies any fevers, chills, nausea or vomiting.   Urge incontinence Patient advised to avoid sugary drinks.  She is experiencing urgency x 0-3 (stable), frequency x 0-3 (improved), she does not limit fluids to avoid using the rest room, she does not engage in toilet mapping, incontinence x 0-3 (stable) and nocturia x 0-3 (stable).    PVR is 106 mL.  Her urinary symptoms consist of an occasional stress incontinence when she has persistent sneezing .  Patient denies any gross hematuria, dysuria or suprapubic/flank pain.  Patient denies any fevers, chills, nausea or vomiting.   Vaginal atrophy Patient is applying the vaginal estrogen cream three nights weekly.  PMH: Past Medical History:  Diagnosis Date  . Heartburn   . HLD (hyperlipidemia)   . HTN (hypertension)     Surgical History: Past Surgical History:  Procedure Laterality Date  . ABDOMINAL HYSTERECTOMY    . ABDOMINAL HYSTERECTOMY    . BREAST CYST ASPIRATION Left    Pt thinks it was her left breast was done yrs ago.  Marland Kitchen BREAST EXCISIONAL BIOPSY Right    1991 negative  . BREAST EXCISIONAL BIOPSY Left    1964 negative 1990 negative    Home  Medications:  Allergies as of 08/05/2019   No Known Allergies     Medication List       Accurate as of August 05, 2019 10:22 PM. If you have any questions, ask your nurse or doctor.        amLODipine-benazepril 5-10 MG capsule Commonly known as: LOTREL TAKE ONE CAPSULE BY MOUTH ONCE A DAY   aspirin EC 81 MG tablet Take by mouth.   atorvastatin 10 MG tablet Commonly known as: LIPITOR Take by mouth.   Calcium Carbonate-Vitamin D3 600-400 MG-UNIT Tabs Take by mouth.   conjugated estrogens vaginal cream Commonly known as: Premarin Place 1 Applicatorful vaginally daily. Apply 0.5mg  (pea-sized amount)  just inside the vaginal introitus with a finger-tip every night for two weeks and then Monday, Wednesday and Friday nights.   estradiol 0.1 MG/GM vaginal cream Commonly known as: ESTRACE VAGINAL Apply 0.5mg  (pea-sized amount)  just inside the vaginal introitus with a finger-tip every night for two weeks and then Monday, Wednesday and Friday nights.   FISH OIL PO Take by mouth.   multivitamin capsule Take by mouth.   omeprazole 40 MG capsule Commonly known as: PRILOSEC Take by mouth.   Vitamin D-1000 Max St 25 MCG (1000 UT) tablet Generic drug: Cholecalciferol Take by mouth.       Allergies: No Known Allergies  Family History: Family History  Problem Relation Age of Onset  . Liver cancer Maternal Aunt   .  Breast cancer Neg Hx   . Kidney cancer Neg Hx   . Prostate cancer Neg Hx   . Bladder Cancer Neg Hx     Social History:  reports that she quit smoking about 51 years ago. Her smoking use included cigarettes. She has never used smokeless tobacco. She reports current alcohol use. She reports that she does not use drugs.  ROS For pertinent review of systems please refer to history of present illness  Physical Exam: BP 112/76   Pulse 89   Ht 5\' 4"  (1.626 m)   Wt 140 lb (63.5 kg)   BMI 24.03 kg/m   Constitutional:  Alert and oriented, No acute distress. HEENT:  [place] AT, mask in place.  Trachea midline. Cardiovascular: No clubbing, cyanosis, or edema. Respiratory: Normal respiratory effort, no increased work of breathing. Neurologic: Grossly intact, no focal deficits, moving all 4 extremities. Psychiatric: Normal mood and affect.  Laboratory Data:  Lab Results  Component Value Date   CREATININE 0.99 01/23/2013    Urinalysis Component     Latest Ref Rng & Units 08/05/2019  Specific Gravity, UA     1.005 - 1.030 1.015  pH, UA     5.0 - 7.5 5.5  Color, UA     Yellow Yellow  Appearance Ur     Clear Cloudy (A)  Leukocytes,UA     Negative 1+ (A)  Protein,UA     Negative/Trace Negative  Glucose, UA     Negative Negative  Ketones, UA     Negative Negative  RBC, UA     Negative Negative  Bilirubin, UA     Negative Negative  Urobilinogen, Ur     0.2 - 1.0 mg/dL 0.2  Nitrite, UA     Negative Negative  Microscopic Examination      See below:   Component     Latest Ref Rng & Units 08/05/2019  WBC, UA     0 - 5 /hpf 6-10 (A)  RBC     0 - 2 /hpf 0-2  Epithelial Cells (non renal)     0 - 10 /hpf 0-10  Bacteria, UA     None seen/Few Few    Pertinent Imaging: Results for KAISLEE, CHAO (MRN [place]) as of 08/05/2019 15:39  Ref. Range 08/05/2019 14:26  Scan Result Unknown [id number]   Assessment & Plan:    1. Vaginal Atrophy Continue to apply vaginal estrogen cream 3 nights weekly She does not need refills at this time   2. History of Recurrent UTIs Asymptomatic at this visit Return in one year for OAB questionnaire and PVR   [facility] [address], [address] [place], [place] [phone]  I, [phone] [place], am acting as a Francina Ames for Neurosurgeon, PA-C.  I have reviewed the above documentation for accuracy and completeness, and I agree with the above.    [employer], PA-C

## 2019-08-06 LAB — URINALYSIS, COMPLETE
Bilirubin, UA: NEGATIVE
Glucose, UA: NEGATIVE
Ketones, UA: NEGATIVE
Nitrite, UA: NEGATIVE
Protein,UA: NEGATIVE
RBC, UA: NEGATIVE
Specific Gravity, UA: 1.015 (ref 1.005–1.030)
Urobilinogen, Ur: 0.2 mg/dL (ref 0.2–1.0)
pH, UA: 5.5 (ref 5.0–7.5)

## 2019-08-06 LAB — MICROSCOPIC EXAMINATION

## 2019-12-13 ENCOUNTER — Other Ambulatory Visit: Payer: Self-pay | Admitting: Urology

## 2020-02-17 ENCOUNTER — Other Ambulatory Visit: Payer: Self-pay | Admitting: Obstetrics and Gynecology

## 2020-02-17 DIAGNOSIS — Z1231 Encounter for screening mammogram for malignant neoplasm of breast: Secondary | ICD-10-CM

## 2020-02-24 ENCOUNTER — Other Ambulatory Visit: Payer: Self-pay

## 2020-02-24 ENCOUNTER — Ambulatory Visit
Admission: RE | Admit: 2020-02-24 | Discharge: 2020-02-24 | Disposition: A | Discharge status: Home / Self Care | Payer: Medicare PPO | Source: Ambulatory Visit | Attending: Obstetrics and Gynecology | Admitting: Obstetrics and Gynecology

## 2020-02-24 DIAGNOSIS — Z1231 Encounter for screening mammogram for malignant neoplasm of breast: Secondary | ICD-10-CM | POA: Diagnosis not present

## 2020-08-03 NOTE — Progress Notes (Signed)
08/05/19 2:22 PM   Bonnell Tawni Millers 16-Oct-1945 [id number]  Referring provider: Maryland Pink, MD [address] [place],  [place] [address] Chief Complaint  Patient presents with  . Vaginal Atrophy   Urological history: 1. rUTI's -contributing factors of age, vaginal atrophy, soaking in tubs, incontinence and consumption of sugary drinks -documented positive cultures over the last year  None  2. Urge incontinence -contributing factors of age, vaginal atrophy, consumption of sugary drinks and diuretics -PVR 5 mL  3. Vaginal atrophy -Negative mammogram in December 2021 -managed with vaginal estrogen cream three nights weekly   HPI: Anne Oconnell is a 75 y.o. who presents today for a one year follow up.  She is experiencing 1-7 daytime urinations, 1-2 nocturia and mild urinary urgency.  She does experience urinary leakage when she coughs and sneezes.  Her urinary leakage frequency is less than 1-2 times weekly.  She is not wearing any absorbent products for her urinary leakage.  She is not engaged in toilet mapping or fluid limiting.  Patient denies any modifying or aggravating factors.  Patient denies any gross hematuria, dysuria or suprapubic/flank pain.  Patient denies any fevers, chills, nausea or vomiting.   PVR 5 mL.   No vaginal discharge.  No vaginal pain.     PMH: Past Medical History:  Diagnosis Date  . Heartburn   . HLD (hyperlipidemia)   . HTN (hypertension)     Surgical History: Past Surgical History:  Procedure Laterality Date  . ABDOMINAL HYSTERECTOMY    . ABDOMINAL HYSTERECTOMY    . BREAST CYST ASPIRATION Left    Pt thinks it was her left breast was done yrs ago.  Marland Kitchen BREAST EXCISIONAL BIOPSY Right    1991 negative  . BREAST EXCISIONAL BIOPSY Left    1964 negative 1990 negative    Home Medications:  Allergies as of 08/04/2020   No Known Allergies     Medication List       Accurate as of August 04, 2020  2:22 PM. If you have any  questions, ask your nurse or doctor.        STOP taking these medications   conjugated estrogens vaginal cream Commonly known as: Premarin Stopped by: Brittania Sudbeck, PA-C     TAKE these medications   amLODipine-benazepril 5-10 MG capsule Commonly known as: LOTREL TAKE ONE CAPSULE BY MOUTH ONCE A DAY   aspirin EC 81 MG tablet Take by mouth.   atorvastatin 10 MG tablet Commonly known as: LIPITOR Take by mouth.   Calcium Carbonate-Vitamin D3 600-400 MG-UNIT Tabs Take by mouth.   Cholecalciferol 25 MCG (1000 UT) tablet Take by mouth.   estradiol 0.1 MG/GM vaginal cream Commonly known as: ESTRACE APPLY 0.5 MG (PEA-SIZED AMOUNT) JUST INSIDE THE VAGINAL INTROTUS WITH A FINGER-TIP EVERY NIGHT FOR 2 WEEKS THEN MONDAY, WEDNESDAY, AND FRIDAY NIGHTS   FISH OIL PO Take by mouth.   multivitamin capsule Take by mouth.   omeprazole 40 MG capsule Commonly known as: PRILOSEC Take by mouth.       Allergies: No Known Allergies  Family History: Family History  Problem Relation Age of Onset  . Liver cancer Maternal Aunt   . Breast cancer Neg Hx   . Kidney cancer Neg Hx   . Prostate cancer Neg Hx   . Bladder Cancer Neg Hx     Social History:  reports that she quit smoking about 52 years ago. Her smoking use included cigarettes. She has never  used smokeless tobacco. She reports current alcohol use. She reports that she does not use drugs.  ROS For pertinent review of systems please refer to history of present illness  Physical Exam: BP 131/84   Pulse 91   Ht 5' 4"  (1.626 m)   Wt 145 lb (65.8 kg)   BMI 24.89 kg/m   Constitutional:  Well nourished. Alert and oriented, No acute distress. HEENT: [place] AT, mask in place.  Trachea midline Cardiovascular: No clubbing, cyanosis, or edema. Respiratory: Normal respiratory effort, no increased work of breathing. Neurologic: Grossly intact, no focal deficits, moving all 4 extremities. Psychiatric: Normal mood and affect.    Laboratory Data: WBC (White Blood Cell Count) 4.1 - 10.2 10^3/uL 3.8Low   RBC (Red Blood Cell Count) 4.04 - 5.48 10^6/uL 5.14   Hemoglobin 12.0 - 15.0 gm/dL 14.2   Hematocrit 35.0 - 47.0 % 44.5   MCV (Mean Corpuscular Volume) 80.0 - 100.0 fl 86.6   MCH (Mean Corpuscular Hemoglobin) 27.0 - 31.2 pg 27.6   MCHC (Mean Corpuscular Hemoglobin Concentration) 32.0 - 36.0 gm/dL 31.9Low   Platelet Count 150 - 450 10^3/uL 239   RDW-CV (Red Cell Distribution Width) 11.6 - 14.8 % 13.9   MPV (Mean Platelet Volume) 9.4 - 12.4 fl 12.5High   Neutrophils 1.50 - 7.80 10^3/uL 1.80   Lymphocytes 1.00 - 3.60 10^3/uL 1.40   Mixed Count 0.10 - 0.90 10^3/uL 0.60   Neutrophil % 32.0 - 70.0 % 47.3   Lymphocyte % 10.0 - 50.0 % 37.7   Mixed % 3.0 - 14.4 % 15.[facility] - [facility]  Specimen Collected: 10/09/19 08:33 Last Resulted: 10/09/19 15:16  Received From: [facility]  Result Received: 08/03/20 09:54   Glucose 70 - 110 mg/dL 79   Sodium 136 - 145 mmol/L 140   Potassium 3.6 - 5.1 mmol/L 4.2   Chloride 97 - 109 mmol/L 105   Carbon Dioxide (CO2) 22.0 - 32.0 mmol/L 30.1   Urea Nitrogen (BUN) 7 - 25 mg/dL 11   Creatinine 0.6 - 1.1 mg/dL 1.0   Glomerular Filtration Rate (eGFR), MDRD Estimate >60 mL/min/1.73sq m 66   Calcium 8.7 - 10.3 mg/dL 9.8   AST  8 - 39 U/L 17   ALT  5 - 38 U/L 12   Alk Phos (alkaline Phosphatase) 34 - 104 U/L 75   Albumin 3.5 - 4.8 g/dL 4.4   Bilirubin, Total 0.3 - 1.2 mg/dL 0.4   Protein, Total 6.1 - 7.9 g/dL 7.3   A/G Ratio 1.0 - 5.0 gm/dL 1.5   Resulting Agency  [facility] - [facility]  Specimen Collected: 10/09/19 08:33 Last Resulted: 10/09/19 14:21  Received From: [facility]  Result Received: 08/03/20 09:54    Urinalysis Color Yellow Yellow   Clarity Clear Clear   Specific Gravity 1.000 - 1.030 1.015   pH, Urine 5.0 - 8.0 6.0   Protein, Urinalysis Negative, Trace mg/dL Negative   Glucose,  Urinalysis Negative mg/dL Negative   Ketones, Urinalysis Negative mg/dL Negative   Blood, Urinalysis Negative Negative   Nitrite, Urinalysis Negative Negative   Leukocyte Esterase, Urinalysis Negative TraceAbnormal   White Blood Cells, Urinalysis None Seen, 0-3 /hpf 0-3   Red Blood Cells, Urinalysis None Seen, 0-3 /hpf None Seen   Bacteria, Urinalysis None Seen /hpf FewAbnormal   Squamous Epithelial Cells, Urinalysis Rare, Few, None Seen /hpf ManyAbnormal   Resulting Agency  [facility] - [facility]  Specimen Collected: 10/09/19 08:33 Last Resulted: 10/09/19 14:27  Received From: [facility]  Result Received: 08/03/20 09:54  I have reviewed the labs.    Pertinent Imaging: Results for JULLIA, MULLIGAN (MRN [id number]) as of 08/04/2020 14:22  Ref. Range 08/04/2020 13:51  Scan Result Unknown 5    Assessment & Plan:    1. Vaginal Atrophy -Continue applying vaginal cream 3 nights weekly -Refill sent for Estrace cream to her pharmacy  2. Urge incontinence -not bothersome at this time  [facility] [address], [place],  [address] [phone]  Zara Council, PA-C

## 2020-08-04 ENCOUNTER — Encounter: Payer: Self-pay | Admitting: Urology

## 2020-08-04 ENCOUNTER — Other Ambulatory Visit: Payer: Self-pay

## 2020-08-04 ENCOUNTER — Ambulatory Visit (INDEPENDENT_AMBULATORY_CARE_PROVIDER_SITE_OTHER): Payer: Medicare PPO | Admitting: Urology

## 2020-08-04 VITALS — BP 131/84 | HR 91 | Ht 64.0 in | Wt 145.0 lb

## 2020-08-04 DIAGNOSIS — N952 Postmenopausal atrophic vaginitis: Secondary | ICD-10-CM

## 2020-08-04 DIAGNOSIS — N3941 Urge incontinence: Secondary | ICD-10-CM

## 2020-08-04 LAB — BLADDER SCAN AMB NON-IMAGING: Scan Result: 5

## 2020-08-04 MED ORDER — ESTRADIOL 0.1 MG/GM VA CREA: TOPICAL_CREAM | VAGINAL | 1 refills | Status: DC

## 2021-02-22 ENCOUNTER — Other Ambulatory Visit: Payer: Self-pay | Admitting: Obstetrics and Gynecology

## 2021-02-22 DIAGNOSIS — Z1231 Encounter for screening mammogram for malignant neoplasm of breast: Secondary | ICD-10-CM

## 2021-02-25 ENCOUNTER — Other Ambulatory Visit: Payer: Self-pay

## 2021-02-25 ENCOUNTER — Ambulatory Visit
Admission: RE | Admit: 2021-02-25 | Discharge: 2021-02-25 | Disposition: A | Discharge status: Home / Self Care | Payer: Medicare PPO | Source: Ambulatory Visit | Attending: Obstetrics and Gynecology | Admitting: Obstetrics and Gynecology

## 2021-02-25 DIAGNOSIS — Z1231 Encounter for screening mammogram for malignant neoplasm of breast: Secondary | ICD-10-CM | POA: Insufficient documentation

## 2021-08-04 ENCOUNTER — Ambulatory Visit: Payer: Medicare PPO | Admitting: Urology

## 2021-08-04 ENCOUNTER — Encounter: Payer: Self-pay | Admitting: Urology

## 2021-08-04 VITALS — BP 112/76 | HR 101 | Ht 63.0 in | Wt 147.0 lb

## 2021-08-04 DIAGNOSIS — N952 Postmenopausal atrophic vaginitis: Secondary | ICD-10-CM | POA: Diagnosis not present

## 2021-08-04 DIAGNOSIS — N3941 Urge incontinence: Secondary | ICD-10-CM | POA: Diagnosis not present

## 2021-08-04 LAB — BLADDER SCAN AMB NON-IMAGING

## 2021-08-04 NOTE — Progress Notes (Signed)
08/04/21 1:17 PM   Anne Oconnell 12-Jan-1946 [id number]  Referring provider:  Maryland Pink, MD [address] [facility] [place],  [place] [address]  Urological history  1. rUTI's -contributing factors of age, vaginal atrophy, soaking in tubs, incontinence and consumption of sugary drinks -documented positive cultures over the last year             None   2. Urge incontinence -contributing factors of age, vaginal atrophy, consumption of sugary drinks and diuretics -PVR 0 ml   3. Vaginal atrophy -Negative mammogram in December 2021 -managed with vaginal estrogen cream three nights weekly   Chief Complaint  Patient presents with   Over Active Bladder     HPI: Anne Oconnell is a 76 y.o.female who presents today for a 1 year follow-up with PVR.   She has 1-7 daytime urination, 1-2 nighttime urination's, mild urge to urinate, stress incontinence, and urge incontinence.   She attributes her urinary incontinence to the amount of fluid intake.   She reports today that she is still using vaginal estrogen cream.   Patient denies any modifying or aggravating factors.  Patient denies any gross hematuria, dysuria or suprapubic/flank pain.  Patient denies any fevers, chills, nausea or vomiting.     PMH: Past Medical History:  Diagnosis Date   Heartburn    HLD (hyperlipidemia)    HTN (hypertension)     Surgical History: Past Surgical History:  Procedure Laterality Date   ABDOMINAL HYSTERECTOMY     ABDOMINAL HYSTERECTOMY     BREAST CYST ASPIRATION Left    Pt thinks it was her left breast was done yrs ago.   BREAST EXCISIONAL BIOPSY Right    1991 negative   BREAST EXCISIONAL BIOPSY Left    1964 negative 1990 negative    Home Medications:  Allergies as of 08/04/2021   No Known Allergies      Medication List        Accurate as of August 04, 2021  1:17 PM. If you have any questions, ask your nurse or doctor.          amLODipine-benazepril 5-10 MG  capsule Commonly known as: LOTREL TAKE ONE CAPSULE BY MOUTH ONCE A DAY   aspirin EC 81 MG tablet Take by mouth.   atorvastatin 10 MG tablet Commonly known as: LIPITOR Take by mouth.   Calcium Carbonate-Vitamin D3 600-400 MG-UNIT Tabs Take by mouth.   Cholecalciferol 25 MCG (1000 UT) tablet Take by mouth.   estradiol 0.1 MG/GM vaginal cream Commonly known as: ESTRACE APPLY 0.5 MG (PEA-SIZED AMOUNT) JUST INSIDE THE VAGINAL INTROTUS WITH A FINGER-TIP EVERY NIGHT FOR 2 WEEKS THEN MONDAY, WEDNESDAY, AND FRIDAY NIGHTS   FISH OIL PO Take by mouth.   multivitamin capsule Take by mouth.   omeprazole 40 MG capsule Commonly known as: PRILOSEC Take by mouth.        Allergies:  No Known Allergies  Family History: Family History  Problem Relation Age of Onset   Liver cancer Maternal Aunt    Breast cancer Neg Hx    Kidney cancer Neg Hx    Prostate cancer Neg Hx    Bladder Cancer Neg Hx     Social History:  reports that she quit smoking about 53 years ago. Her smoking use included cigarettes. She has never used smokeless tobacco. She reports current alcohol use. She reports that she does not use drugs.   Physical Exam: BP 112/76   Pulse (!) 101  Ht 5' 3"  (1.6 m)   Wt 147 lb (66.7 kg)   BMI 26.04 kg/m   Constitutional:  Alert and oriented, No acute distress. HEENT: [place] AT, moist mucus membranes.  Trachea midline Cardiovascular: No clubbing, cyanosis, or edema. Respiratory: Normal respiratory effort, no increased work of breathing. Neurologic: Grossly intact, no focal deficits, moving all 4 extremities. Psychiatric: Normal mood and affect.   Laboratory Data: Glucose 70 - 110 mg/dL 86   Sodium 136 - 145 mmol/L 141   Potassium 3.6 - 5.1 mmol/L 3.7   Chloride 97 - 109 mmol/L 107   Carbon Dioxide (CO2) 22.0 - 32.0 mmol/L 30.4   Urea Nitrogen (BUN) 7 - 25 mg/dL 13   Creatinine 0.6 - 1.1 mg/dL 1.0   Glomerular Filtration Rate (eGFR), MDRD Estimate >60 mL/min/1.73sq m  65   Calcium 8.7 - 10.3 mg/dL 9.2   AST  8 - 39 U/L 14   ALT  5 - 38 U/L 11   Alk Phos (alkaline Phosphatase) 34 - 104 U/L 81   Albumin 3.5 - 4.8 g/dL 4.2   Bilirubin, Total 0.3 - 1.2 mg/dL 0.4   Protein, Total 6.1 - 7.9 g/dL 7.1   A/G Ratio 1.0 - 5.0 gm/dL 1.4   Resulting Agency  [facility] - LAB  Specimen Collected: 10/15/20 08:44 Last Resulted: 10/15/20 12:28  Received From: [facility]  Result Received: 02/22/21 09:09   Color Yellow, Violet, Light Violet, Dark Violet Yellow   Clarity Clear, Other Clear   Specific Gravity 1.000 - 1.030 1.020   pH, Urine 5.0 - 8.0 5.5   Protein, Urinalysis Negative, Trace mg/dL Negative   Glucose, Urinalysis Negative mg/dL Negative   Ketones, Urinalysis Negative mg/dL Negative   Blood, Urinalysis Negative Negative   Nitrite, Urinalysis Negative Negative   Leukocyte Esterase, Urinalysis Negative Negative   White Blood Cells, Urinalysis None Seen, 0-3 /hpf 4-10 Abnormal    Red Blood Cells, Urinalysis None Seen, 0-3 /hpf 0-3   Bacteria, Urinalysis None Seen /hpf Moderate Abnormal    Squamous Epithelial Cells, Urinalysis Rare, Few, None Seen /hpf Moderate Abnormal    Resulting Agency  [facility] - [facility]  Specimen Collected: 10/15/20 08:44 Last Resulted: 10/15/20 14:05  Received From: [facility]  Result Received: 02/22/21 09:09   WBC (White Blood Cell Count) 4.1 - 10.2 10^3/uL 5.2   RBC (Red Blood Cell Count) 4.04 - 5.48 10^6/uL 4.87   Hemoglobin 12.0 - 15.0 gm/dL 13.8   Hematocrit 35.0 - 47.0 % 41.8   MCV (Mean Corpuscular Volume) 80.0 - 100.0 fl 85.8   MCH (Mean Corpuscular Hemoglobin) 27.0 - 31.2 pg 28.3   MCHC (Mean Corpuscular Hemoglobin Concentration) 32.0 - 36.0 gm/dL 33.0   Platelet Count 150 - 450 10^3/uL 265   RDW-CV (Red Cell Distribution Width) 11.6 - 14.8 % 14.1   MPV (Mean Platelet Volume) 9.4 - 12.4 fl 12.1   Neutrophils 1.50 - 7.80 10^3/uL 2.48   Lymphocytes 1.00 - 3.60  10^3/uL 1.85   Monocytes 0.00 - 1.50 10^3/uL 0.49   Eosinophils 0.00 - 0.55 10^3/uL 0.29   Basophils 0.00 - 0.09 10^3/uL 0.04   Neutrophil % 32.0 - 70.0 % 48.0   Lymphocyte % 10.0 - 50.0 % 35.9   Monocyte % 4.0 - 13.0 % 9.5   Eosinophil % 1.0 - 5.0 % 5.6 High    Basophil% 0.0 - 2.0 % 0.8   Immature Granulocyte % <=0.7 % 0.2   Immature  Granulocyte Count <=0.06 10^3/L 0.01   Resulting Agency  [facility] - [facility]  Specimen Collected: 10/15/20 08:44 Last Resulted: 10/15/20 11:33  Received From: [facility]  Result Received: 02/22/21 09:09  I have reviewed the labs.     Pertinent Imaging: Results for orders placed or performed in visit on 08/04/21  Bladder Scan (Post Void Residual) in office  Result Value Ref Range   Scan Result 29m      Assessment & Plan:    1. Vaginal Atrophy -Continue applying vaginal cream 3 nights weekly  2. Urge incontinence -Not bothersome at this time - She is emptying adequately with PVR of 0102m- She attributes her symptoms to fluid intake.  - Discussed behavioral management   Return in 1 year for OAB questionnaire and PVR.   [address]ett[address] Acacia StreetSuPattonsburguTerrebonneNC [id number],Kailey Littlejohn,acting as a scribe for SHSpecialty Surgical Center Of Arcad[facility] LPPA-C.,have documented all relevant documentation on the behalf of Anne Grothe, PA-C,as directed by  SHBanner Desert Medical CenterPA-C while in the presence of SHWestwoodPA-C.  I have reviewed the above documentation for accuracy and completeness, and I agree with the above.    ShZara CouncilPA-C

## 2022-01-13 ENCOUNTER — Other Ambulatory Visit: Payer: Self-pay | Admitting: Urology

## 2022-02-23 ENCOUNTER — Other Ambulatory Visit: Payer: Self-pay | Admitting: Obstetrics and Gynecology

## 2022-02-23 DIAGNOSIS — Z1231 Encounter for screening mammogram for malignant neoplasm of breast: Secondary | ICD-10-CM

## 2022-03-01 ENCOUNTER — Ambulatory Visit
Admission: RE | Admit: 2022-03-01 | Discharge: 2022-03-01 | Disposition: A | Discharge status: Home / Self Care | Payer: Medicare PPO | Source: Ambulatory Visit | Attending: Obstetrics and Gynecology | Admitting: Obstetrics and Gynecology

## 2022-03-01 DIAGNOSIS — Z1231 Encounter for screening mammogram for malignant neoplasm of breast: Secondary | ICD-10-CM | POA: Diagnosis present

## 2022-07-28 IMAGING — MG MM DIGITAL SCREENING BILAT W/ TOMO AND CAD
6 of 10 series · 6 of 30 positions shown · non-contrast
Comparison: Previous exam(s).

CLINICAL DATA: Screening.

EXAM:
DIGITAL SCREENING BILATERAL MAMMOGRAM WITH TOMOSYNTHESIS AND CAD
TECHNIQUE: Bilateral screening digital craniocaudal and mediolateral oblique
mammograms were obtained. Bilateral screening digital breast
tomosynthesis was performed. The images were evaluated with
computer-aided detection.

[R CC synth-2D (1 of 2)]
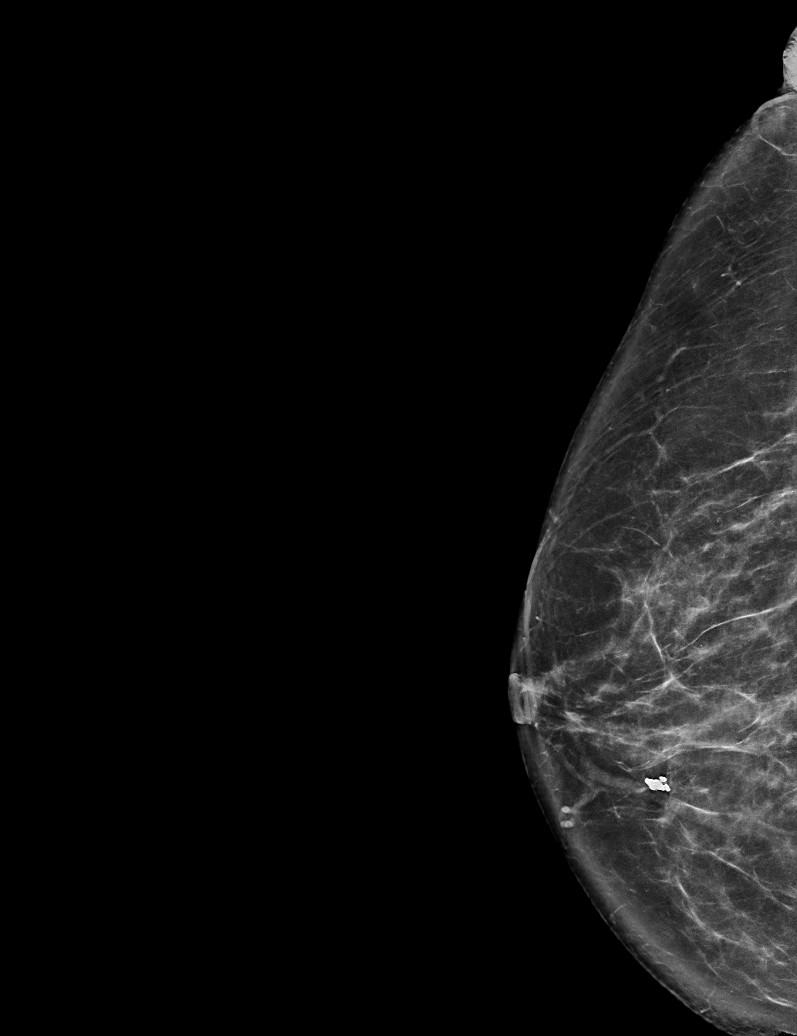

[R CC synth-2D (2 of 2)]
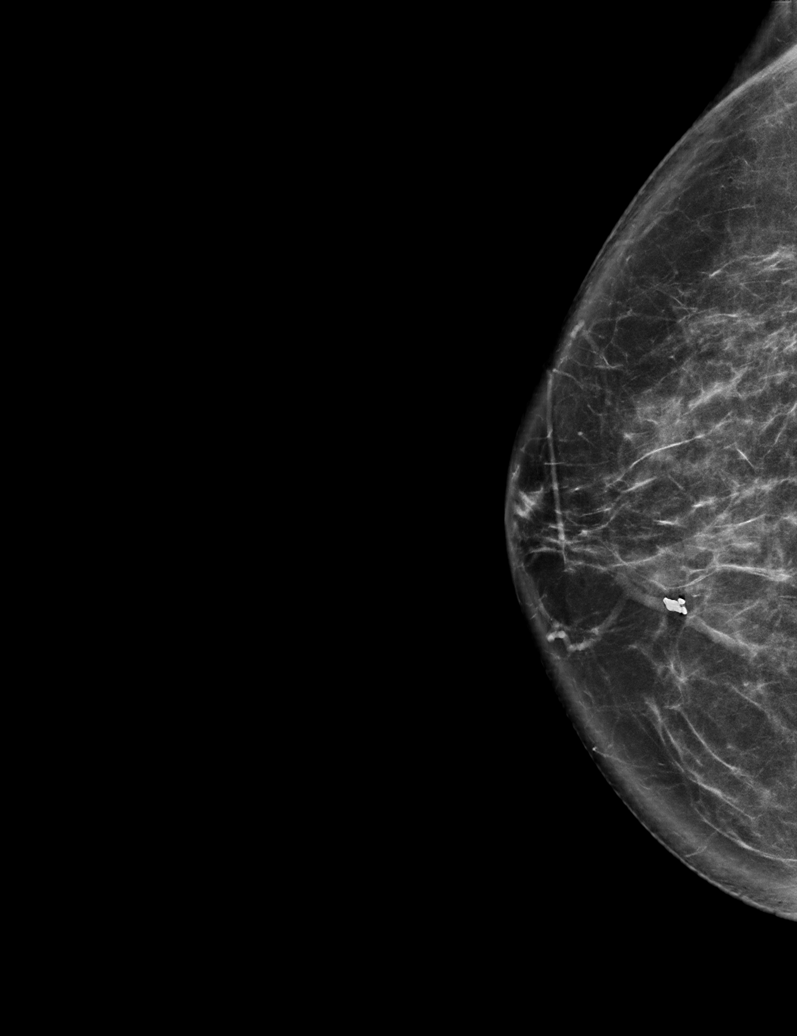

[L MLO synth-2D]
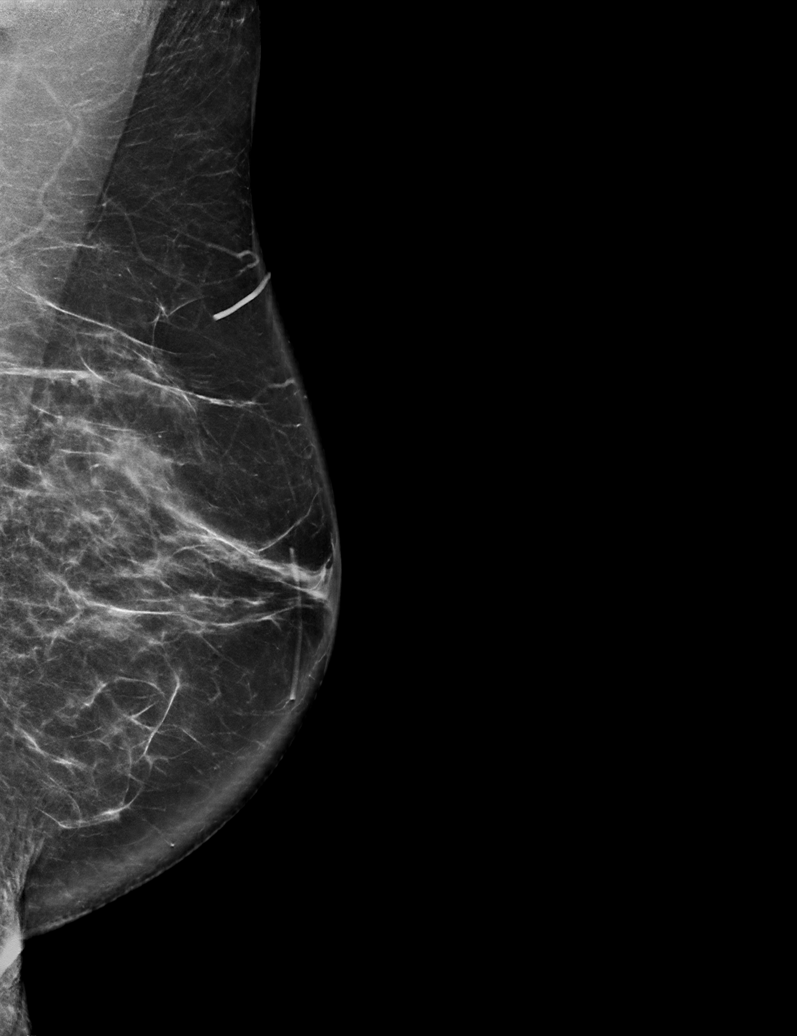

[R MLO synth-2D]
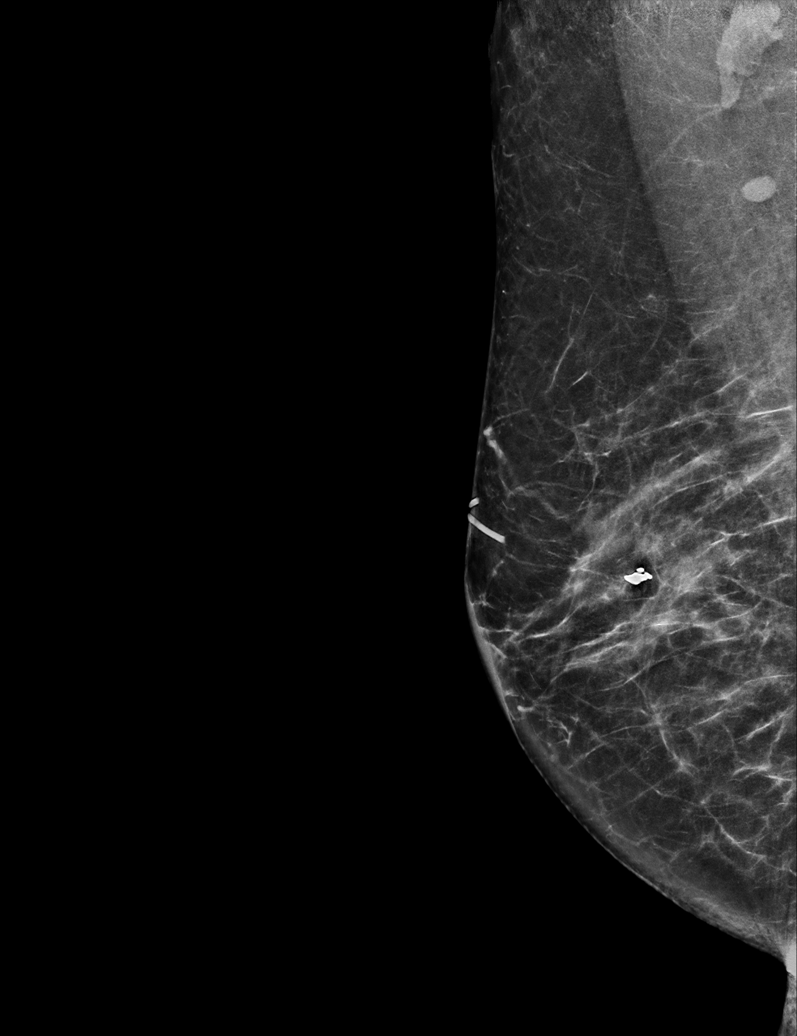

[L CC synth-2D]
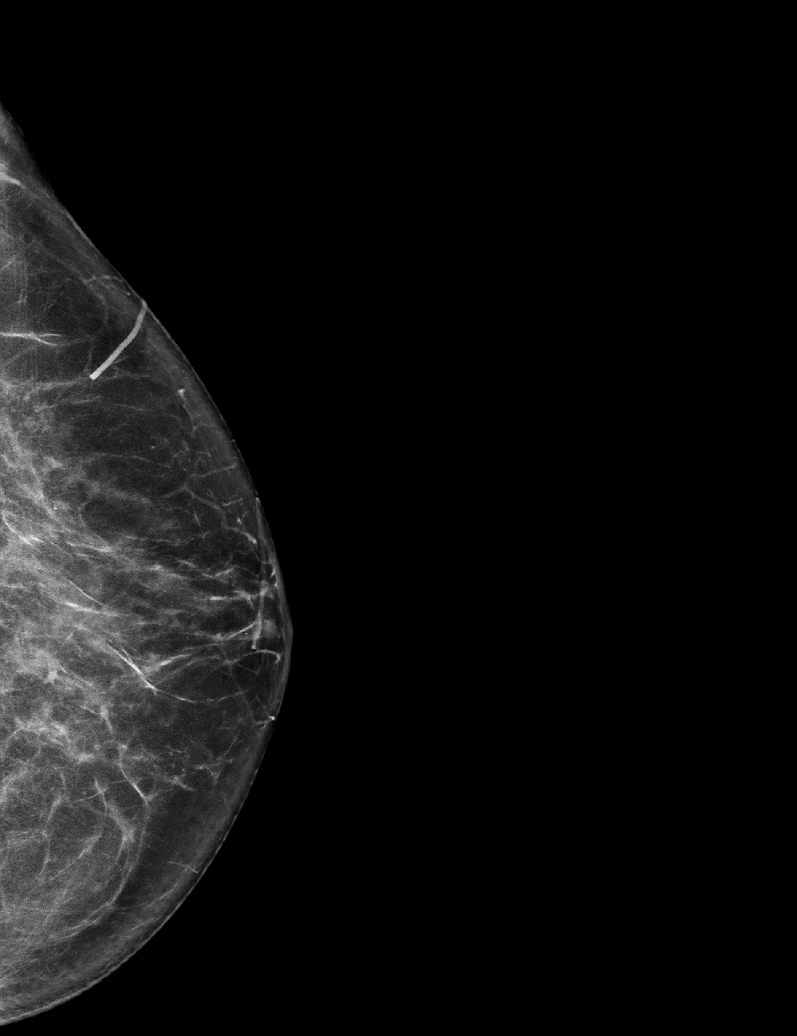

[R MLO tomo · tomo slice 33/66.0]
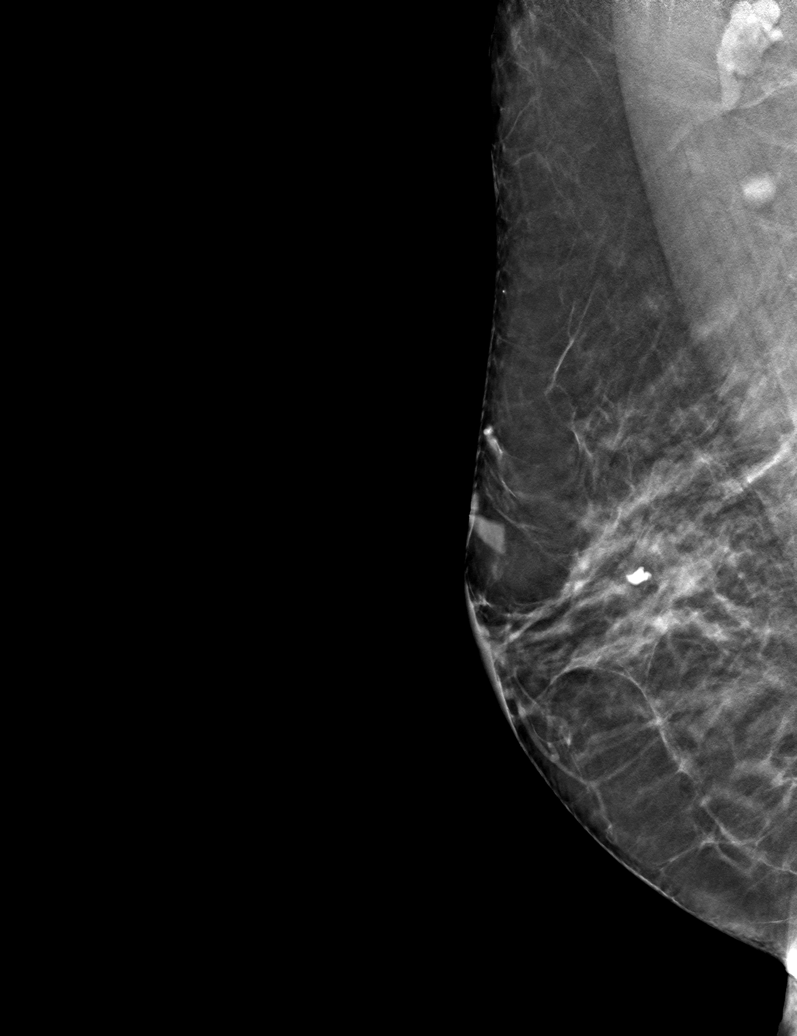

[6 of 30 positions shown; findings below may reference images not displayed]

ACR Breast Density Category c: The breast tissue is heterogeneously
dense, which may obscure small masses.
FINDINGS: There are no findings suspicious for malignancy.
IMPRESSION: No mammographic evidence of malignancy. A result letter of this
screening mammogram will be mailed directly to the patient.

RECOMMENDATION:
Screening mammogram in one year. (Code:Q3-W-BC3)

BI-RADS CATEGORY  1: Negative.

## 2022-08-07 NOTE — Progress Notes (Unsigned)
08/08/22 1:47 PM   Anne Oconnell 09/12/1945 [id number]  Referring provider:  Jerl Mina, MD [address] [facility] [place],  [place] [address]  Urological history  1. rUTI's -contributing factors of age, vaginal atrophy, soaking in tubs, incontinence and consumption of sugary drinks -documented urine cultures over the last year  December 05, 2021 mixed urogenital flora  October 27, 2021 Enterococcus faecalis          2. Urge incontinence -contributing factors of age, vaginal atrophy, consumption of sugary drinks and diuretics   3. Vaginal atrophy -Negative mammogram in December 2023 -managed with vaginal estrogen cream three nights weekly   Chief Complaint  Patient presents with   Urinary Incontinence     HPI: Anne Oconnell is a 77 y.o.female who presents today for a 1 year follow-up.  She is having 1-7 daytime urinations, nocturia x 1-2 with a mild urge to urinate.  She has rare urge incontinence.  She does not wear products for urinary leakage.  She does not limit fluid intake.  She does not engage in toilet mapping.  Urinalysis color yellow clear, specific gravity 1.020, pH 6.0, 1+ leukocyte, 11-30 WBCs, 0-2 RBCs, greater than 10 epithelial cells, mucus threads are present with moderate bacteria.  She has not been as diligent applying her vaginal estrogen cream as she is taking care of her husband.  He was recently hospitalized and has a Foley catheter in place.  He is actually seeing Dr. Richardo Hanks in a couple weeks.  PVR 0 mL  PMH: Past Medical History:  Diagnosis Date   Heartburn    HLD (hyperlipidemia)    HTN (hypertension)     Surgical History: Past Surgical History:  Procedure Laterality Date   ABDOMINAL HYSTERECTOMY     ABDOMINAL HYSTERECTOMY     BREAST CYST ASPIRATION Left    Pt thinks it was her left breast was done yrs ago.   BREAST EXCISIONAL BIOPSY Right    1991 negative   BREAST EXCISIONAL BIOPSY Left    1964 negative 1990 negative     Home Medications:  Allergies as of 08/08/2022   No Known Allergies      Medication List        Accurate as of August 08, 2022  1:47 PM. If you have any questions, ask your nurse or doctor.          amLODipine-benazepril 5-10 MG capsule Commonly known as: LOTREL TAKE ONE CAPSULE BY MOUTH ONCE A DAY   aspirin EC 81 MG tablet Take by mouth.   atorvastatin 10 MG tablet Commonly known as: LIPITOR Take by mouth.   Calcium Carbonate-Vitamin D3 600-400 MG-UNIT Tabs Take by mouth.   Cholecalciferol 25 MCG (1000 UT) tablet Take by mouth.   estradiol 0.1 MG/GM vaginal cream Commonly known as: ESTRACE APPLY 0.5 MG (PEA-SIZED AMOUNT) JUST INSIDE THE VAGINAL INTROTUS WITH A FINGER-TIP EVERY NIGHT FOR 2 WEEKS THEN MONDAY, WEDNESDAY, AND FRIDAY NIGHTS   FISH OIL PO Take by mouth.   multivitamin capsule Take by mouth.   omeprazole 40 MG capsule Commonly known as: PRILOSEC Take by mouth.        Allergies:  No Known Allergies  Family History: Family History  Problem Relation Age of Onset   Liver cancer Maternal Aunt    Breast cancer Neg Hx    Kidney cancer Neg Hx    Prostate cancer Neg Hx    Bladder Cancer Neg Hx     Social History:  reports that she quit smoking about 54 years ago. Her smoking use included cigarettes. She has never used smokeless tobacco. She reports current alcohol use. She reports that she does not use drugs.   Physical Exam: BP 120/83   Pulse 93   Ht 5\' 2"  (1.575 m)   Wt 134 lb (60.8 kg)   BMI 24.51 kg/m   Constitutional:  Well nourished. Alert and oriented, No acute distress. HEENT: [place] AT, moist mucus membranes.  Trachea midline Cardiovascular: No clubbing, cyanosis, or edema. Respiratory: Normal respiratory effort, no increased work of breathing. Neurologic: Grossly intact, no focal deficits, moving all 4 extremities. Psychiatric: Normal mood and affect.    Laboratory Data: Serum creatinine 0.9 with a EGFR of 74 on October 26, 2021 Urinalysis  See EPIC and HPI I have reviewed the labs.   Pertinent Imaging:  08/08/22 13:46  Scan Result 0   Assessment & Plan:    1. Vaginal Atrophy -Continue applying vaginal cream 3 nights weekly  2. Urge incontinence -Not bothersome at this time - She is emptying adequately with PVR of 0ml  Return in about 1 year (around 08/08/2023) for UA, PVR and OAB questionnaire.   Cloretta Ned   [facility] [address], [address] [place], [place] [address] [phone]

## 2022-08-08 ENCOUNTER — Ambulatory Visit: Payer: Medicare PPO | Admitting: Urology

## 2022-08-08 ENCOUNTER — Encounter: Payer: Self-pay | Admitting: Urology

## 2022-08-08 VITALS — BP 120/83 | HR 93 | Ht 62.0 in | Wt 134.0 lb

## 2022-08-08 DIAGNOSIS — N3941 Urge incontinence: Secondary | ICD-10-CM

## 2022-08-08 DIAGNOSIS — N952 Postmenopausal atrophic vaginitis: Secondary | ICD-10-CM

## 2022-08-08 LAB — URINALYSIS, COMPLETE
Bilirubin, UA: NEGATIVE
Glucose, UA: NEGATIVE
Ketones, UA: NEGATIVE
Nitrite, UA: NEGATIVE
Protein,UA: NEGATIVE
RBC, UA: NEGATIVE
Specific Gravity, UA: 1.02 (ref 1.005–1.030)
Urobilinogen, Ur: 0.2 mg/dL (ref 0.2–1.0)
pH, UA: 6 (ref 5.0–7.5)

## 2022-08-08 LAB — MICROSCOPIC EXAMINATION: Epithelial Cells (non renal): >10 /hpf — AB (ref 0–10)

## 2022-08-08 LAB — BLADDER SCAN AMB NON-IMAGING: Scan Result: 0

## 2022-11-24 ENCOUNTER — Other Ambulatory Visit: Payer: Self-pay | Admitting: Internal Medicine

## 2022-11-24 DIAGNOSIS — I2089 Other forms of angina pectoris: Secondary | ICD-10-CM

## 2022-11-24 DIAGNOSIS — I10 Essential (primary) hypertension: Secondary | ICD-10-CM

## 2022-11-27 ENCOUNTER — Other Ambulatory Visit: Payer: Medicare PPO

## 2022-11-27 ENCOUNTER — Other Ambulatory Visit: Payer: Self-pay | Admitting: Internal Medicine

## 2022-11-27 DIAGNOSIS — I10 Essential (primary) hypertension: Secondary | ICD-10-CM

## 2022-11-27 DIAGNOSIS — I2089 Other forms of angina pectoris: Secondary | ICD-10-CM

## 2022-11-28 ENCOUNTER — Telehealth (HOSPITAL_COMMUNITY): Payer: Self-pay | Admitting: *Deleted

## 2022-11-28 MED ORDER — METOPROLOL TARTRATE 100 MG PO TABS: ORAL_TABLET | ORAL | 0 refills | Status: AC

## 2022-11-28 MED ORDER — IVABRADINE HCL 7.5 MG PO TABS: 15.0000 mg | ORAL_TABLET | Freq: Once | ORAL | 0 refills | Status: AC

## 2022-11-28 NOTE — Telephone Encounter (Signed)
Reaching out to patient to offer assistance regarding upcoming cardiac imaging study; pt verbalizes understanding of appt date/time, parking situation and where to check in, pre-test NPO status and medications ordered, and verified current allergies; name and call back number provided for further questions should they arise Johney Frame RN Navigator Cardiac Imaging [facility] [phone] office [phone] cell  Patient confirms BP+ HR (132/76, 92). Patient aware to take 100mg  metoprolol+15mg  ivabradine two hours prior.  Medication sent to pharmacy.

## 2022-11-30 ENCOUNTER — Ambulatory Visit
Admission: RE | Admit: 2022-11-30 | Discharge: 2022-11-30 | Disposition: A | Discharge status: Home / Self Care | Payer: Medicare PPO | Source: Ambulatory Visit | Attending: Internal Medicine | Admitting: Internal Medicine

## 2022-11-30 DIAGNOSIS — I10 Essential (primary) hypertension: Secondary | ICD-10-CM | POA: Diagnosis present

## 2022-11-30 DIAGNOSIS — I2089 Other forms of angina pectoris: Secondary | ICD-10-CM | POA: Insufficient documentation

## 2022-11-30 MED ORDER — SODIUM CHLORIDE 0.9 % IV BOLUS
150.0000 mL | Freq: Once | INTRAVENOUS | Status: AC
  Administered 2022-11-30: 150 mL via INTRAVENOUS

## 2022-11-30 MED ORDER — IOHEXOL 350 MG/ML SOLN
75.0000 mL | Freq: Once | INTRAVENOUS | Status: AC | PRN
  Administered 2022-11-30: 75 mL via INTRAVENOUS

## 2022-11-30 MED ORDER — METOPROLOL TARTRATE 5 MG/5ML IV SOLN
5.0000 mg | Freq: Once | INTRAVENOUS | Status: AC | PRN
  Administered 2022-11-30: 5 mg via INTRAVENOUS

## 2022-11-30 MED ORDER — NITROGLYCERIN 0.4 MG SL SUBL
0.8000 mg | SUBLINGUAL_TABLET | Freq: Once | SUBLINGUAL | Status: AC
  Administered 2022-11-30: 0.8 mg via SUBLINGUAL

## 2022-11-30 NOTE — Progress Notes (Signed)
Patient tolerated CT well. Drank water after. Vital signs stable encourage to drink water throughout day.Reasons explained and verbalized understanding. Ambulated steady gait.  

## 2023-02-19 ENCOUNTER — Other Ambulatory Visit: Payer: Self-pay | Admitting: Obstetrics and Gynecology

## 2023-02-19 DIAGNOSIS — Z1231 Encounter for screening mammogram for malignant neoplasm of breast: Secondary | ICD-10-CM

## 2023-03-06 ENCOUNTER — Ambulatory Visit
Admission: RE | Admit: 2023-03-06 | Discharge: 2023-03-06 | Disposition: A | Discharge status: Home / Self Care | Payer: Medicare PPO | Source: Ambulatory Visit | Attending: Obstetrics and Gynecology | Admitting: Obstetrics and Gynecology

## 2023-03-06 DIAGNOSIS — Z1231 Encounter for screening mammogram for malignant neoplasm of breast: Secondary | ICD-10-CM | POA: Insufficient documentation

## 2023-08-06 NOTE — Progress Notes (Unsigned)
 08/08/23 2:05 PM   Anne Oconnell 06-27-1945 [id number]  Referring provider:  Lyle San, MD [address] [place],  [place] [address]  Urological history  1. rUTI's - September 5th, 2024 -   <10,000 colonies   2. Urge incontinence   3. Vaginal atrophy  Chief Complaint  Patient presents with   Follow-up    HPI: Anne Oconnell is a 78 y.o.female who presents today for a 1 year follow-up.  Previous records reviewed.     She feels well today.  Her husband recently passed away in Oct 12, 2023.  She has a good support system with good neighbors and a supportive family.  Patient denies any modifying or aggravating factors.  Patient denies any recent UTI's, gross hematuria, dysuria or suprapubic/flank pain.  Patient denies any fevers, chills, nausea or vomiting.    She has 1-7 daytime voids, 1-2 episodes of nocturia with a mild urge to urinate.  She has no urinary leakage.  She does not limit fluids and she does not engage in toilet mapping.  UA yellow clear, specific area 1.015, pH 6.5, 1+ leukocyte, 11-30 WBCs, 0-2 RBCs, greater than 10 epithelial cells, mucus threads present and many bacteria.  PMH: Past Medical History:  Diagnosis Date   Heartburn    HLD (hyperlipidemia)    HTN (hypertension)     Surgical History: Past Surgical History:  Procedure Laterality Date   ABDOMINAL HYSTERECTOMY     ABDOMINAL HYSTERECTOMY     BREAST CYST ASPIRATION Left    Pt thinks it was her left breast was done yrs ago.   BREAST EXCISIONAL BIOPSY Right    1991 negative   BREAST EXCISIONAL BIOPSY Left    1964 negative 1990 negative    Home Medications:  Allergies as of 08/08/2023   No Known Allergies      Medication List        Accurate as of August 08, 2023  2:05 PM. If you have any questions, ask your nurse or doctor.          amLODipine-benazepril 5-10 MG capsule Commonly known as: LOTREL TAKE ONE CAPSULE BY MOUTH ONCE A DAY   aspirin EC 81 MG tablet Take  by mouth.   atorvastatin 10 MG tablet Commonly known as: LIPITOR Take by mouth.   Calcium Carbonate-Vitamin D3 600-400 MG-UNIT Tabs Take by mouth.   Cholecalciferol 25 MCG (1000 UT) tablet Take by mouth.   estradiol  0.1 MG/GM vaginal cream Commonly known as: ESTRACE  APPLY 0.5 MG (PEA-SIZED AMOUNT) JUST INSIDE THE VAGINAL INTROTUS WITH A FINGER-TIP EVERY NIGHT FOR 2 WEEKS THEN MONDAY, WEDNESDAY, AND FRIDAY NIGHTS   FISH OIL PO Take by mouth.   metoprolol  tartrate 100 MG tablet Commonly known as: LOPRESSOR  Take 1 tablet (100mg ) TWO hours prior to CT scan   multivitamin capsule Take by mouth.   omeprazole 40 MG capsule Commonly known as: PRILOSEC Take by mouth.        Allergies:  No Known Allergies  Family History: Family History  Problem Relation Age of Onset   Liver cancer Maternal Aunt    Breast cancer Neg Hx    Kidney cancer Neg Hx    Prostate cancer Neg Hx    Bladder Cancer Neg Hx     Social History:  reports that she quit smoking about 55 years ago. Her smoking use included cigarettes. She has never used smokeless tobacco. She reports current alcohol use. She reports that she does not use drugs.  Physical Exam: BP 118/81   Pulse (!) 106   Ht 5\' 3"  (1.6 m)   Wt 135 lb (61.2 kg)   BMI 23.91 kg/m   Constitutional:  Well nourished. Alert and oriented, No acute distress. HEENT: [place] AT, moist mucus membranes.  Trachea midline Cardiovascular: No clubbing, cyanosis, or edema. Respiratory: Normal respiratory effort, no increased work of breathing. Neurologic: Grossly intact, no focal deficits, moving all 4 extremities. Psychiatric: Normal mood and affect.    Laboratory Data: Basic Metabolic Panel (BMP) Order: [id number] Component Ref Range & Units 7 mo ago  Glucose 70 - 110 mg/dL 79  Sodium 409 - 811 mmol/L 139  Potassium 3.6 - 5.1 mmol/L 4.2  Chloride 97 - 109 mmol/L 104  Carbon Dioxide (CO2) 22.0 - 32.0 mmol/L 27.7  Calcium 8.7 - 10.3 mg/dL  9.7  Urea Nitrogen (BUN) 7 - 25 mg/dL 14  Creatinine 0.6 - 1.1 mg/dL 0.9  Glomerular Filtration Rate (eGFR) >60 mL/min/1.73sq m 65  Comment: CKD-EPI (2021) does not include patient's race in the calculation of eGFR.  Monitoring changes of plasma creatinine and eGFR over time is useful for monitoring kidney function.  Interpretive Ranges for eGFR (CKD-EPI 2021):  eGFR:       >60 mL/min/1.73 sq. m - Normal eGFR:       30-59 mL/min/1.73 sq. m - Moderately Decreased eGFR:       15-29 mL/min/1.73 sq. m  - Severely Decreased eGFR:       < 15 mL/min/1.73 sq. m  - Kidney Failure   Note: These eGFR calculations do not apply in acute situations when eGFR is changing rapidly or patients on dialysis.  BUN/Crea Ratio 6.0 - 20.0 15.6  Anion Gap w/K 6.0 - 16.0 11.5  Resulting Agency [facility]   Specimen Collected: 12/11/22 09:02   Performed by: [facility] Last Resulted: 12/11/22 15:52  Received From: [facility]  Result Received: 12/20/22 21:14   CBC w/auto Differential (3 Part) Order: [id number] Component Ref Range & Units 9 mo ago  WBC (White Blood Cell Count) 4.1 - 10.2 10^3/uL 4.4  RBC (Red Blood Cell Count) 4.04 - 5.48 10^6/uL 4.96  Hemoglobin 12.0 - 15.0 gm/dL 21.3  Hematocrit 08.6 - 47.0 % 43.5  MCV (Mean Corpuscular Volume) 80.0 - 100.0 fl 87.7  MCH (Mean Corpuscular Hemoglobin) 27.0 - 31.2 pg 28.6  MCHC (Mean Corpuscular Hemoglobin Concentration) 32.0 - 36.0 gm/dL 57.8  Platelet Count 469 - 450 10^3/uL 255  RDW-CV (Red Cell Distribution Width) 11.6 - 14.8 % 13.9  MPV (Mean Platelet Volume) 9.4 - 12.4 fl 11.5  Neutrophils 1.50 - 7.80 10^3/uL 2.40  Lymphocytes 1.00 - 3.60 10^3/uL 1.60  Mixed Count 0.10 - 0.90 10^3/uL 0.40  Neutrophil % 32.0 - 70.0 % 53.5  Lymphocyte % 10.0 - 50.0 % 37.4  Mixed % 3.0 - 14.4 % 9.1  Resulting Agency [facility]   Specimen Collected: 11/02/22 08:15   Performed by:  [facility] Last Resulted: 11/02/22 10:52  Received From: [facility]  Result Received: 11/24/22 10:40   Urinalysis See EPIC and HPI I have reviewed the labs.   Pertinent Imaging: N/A  Assessment & Plan:    1. Vaginal Atrophy - Continue vaginal estrogen cream  2. Urge incontinence - No bothersome symptoms  Return in about 1 year (around 08/07/2024) for OAB questionnaire.   Briant Camper   [facility]  [address] [place], [place] [address] [phone]

## 2023-08-08 ENCOUNTER — Encounter: Payer: Self-pay | Admitting: Urology

## 2023-08-08 ENCOUNTER — Ambulatory Visit: Payer: Medicare PPO | Admitting: Urology

## 2023-08-08 VITALS — BP 118/81 | HR 106 | Ht 63.0 in | Wt 135.0 lb

## 2023-08-08 DIAGNOSIS — N3941 Urge incontinence: Secondary | ICD-10-CM

## 2023-08-08 DIAGNOSIS — N952 Postmenopausal atrophic vaginitis: Secondary | ICD-10-CM

## 2023-08-08 LAB — MICROSCOPIC EXAMINATION: Epithelial Cells (non renal): >10 /HPF — AB (ref 0–10)

## 2023-08-08 LAB — URINALYSIS, COMPLETE
Bilirubin, UA: NEGATIVE
Glucose, UA: NEGATIVE
Ketones, UA: NEGATIVE
Nitrite, UA: NEGATIVE
Protein,UA: NEGATIVE
RBC, UA: NEGATIVE
Specific Gravity, UA: 1.015 (ref 1.005–1.030)
Urobilinogen, Ur: 0.2 mg/dL (ref 0.2–1.0)
pH, UA: 6.5 (ref 5.0–7.5)

## 2024-01-29 ENCOUNTER — Other Ambulatory Visit: Payer: Self-pay | Admitting: Family Medicine

## 2024-01-29 DIAGNOSIS — Z1231 Encounter for screening mammogram for malignant neoplasm of breast: Secondary | ICD-10-CM

## 2024-03-12 ENCOUNTER — Ambulatory Visit
Admission: RE | Admit: 2024-03-12 | Discharge: 2024-03-12 | Disposition: A | Discharge status: Home / Self Care | Source: Ambulatory Visit | Attending: Family Medicine | Admitting: Family Medicine

## 2024-03-12 DIAGNOSIS — Z1231 Encounter for screening mammogram for malignant neoplasm of breast: Secondary | ICD-10-CM | POA: Diagnosis present

## 2024-08-07 ENCOUNTER — Ambulatory Visit: Admitting: Urology
